# Patient Record
Sex: Female | Born: 1970 | Race: Black or African American | Hispanic: No | Marital: Married | Smoking: Never smoker
Health system: Southern US, Community
[De-identification: ages and names within clinical notes are randomized; demographics above are authoritative.]

---

## 2003-03-09 ENCOUNTER — Emergency Department (HOSPITAL_COMMUNITY): Admission: EM | Admit: 2003-03-09 | Discharge: 2003-03-09 | Payer: Self-pay | Admitting: Emergency Medicine

## 2008-02-18 ENCOUNTER — Emergency Department (HOSPITAL_BASED_OUTPATIENT_CLINIC_OR_DEPARTMENT_OTHER): Admission: EM | Admit: 2008-02-18 | Discharge: 2008-02-19 | Payer: Self-pay | Admitting: Emergency Medicine

## 2010-12-18 LAB — URINE CULTURE: Colony Count: >=100000

## 2010-12-18 LAB — URINALYSIS, ROUTINE W REFLEX MICROSCOPIC
Bilirubin Urine: NEGATIVE
Glucose, UA: NEGATIVE mg/dL
Hgb urine dipstick: NEGATIVE
Ketones, ur: 40 mg/dL — AB
Nitrite: NEGATIVE
Protein, ur: NEGATIVE mg/dL
Specific Gravity, Urine: 1.02 (ref 1.005–1.030)
Urobilinogen, UA: 0.2 mg/dL (ref 0.0–1.0)
pH: 6 (ref 5.0–8.0)

## 2010-12-18 LAB — CBC
HCT: 40.5 % (ref 36.0–46.0)
Hemoglobin: 13.1 g/dL (ref 12.0–15.0)
MCHC: 32.4 g/dL (ref 30.0–36.0)
MCV: 97.1 fL (ref 78.0–100.0)
Platelets: 337 K/uL (ref 150–400)
RBC: 4.17 MIL/uL (ref 3.87–5.11)
RDW: 13 % (ref 11.5–15.5)
WBC: 6.9 K/uL (ref 4.0–10.5)

## 2010-12-18 LAB — COMPREHENSIVE METABOLIC PANEL
Alkaline Phosphatase: 86 U/L (ref 39–117)
BUN: 9 mg/dL (ref 6–23)
CO2: 27 mEq/L (ref 19–32)
GFR calc non Af Amer: >60 mL/min (ref >60)
Glucose, Bld: 77 mg/dL (ref 70–99)
Potassium: 3.5 mEq/L (ref 3.5–5.1)
Total Protein: 7.9 g/dL (ref 6.0–8.3)

## 2010-12-18 LAB — URINE MICROSCOPIC-ADD ON

## 2010-12-18 LAB — POCT TOXICOLOGY PANEL

## 2010-12-18 LAB — COMPREHENSIVE METABOLIC PANEL WITH GFR
ALT: 11 U/L (ref 0–35)
AST: 22 U/L (ref 0–37)
Albumin: 4.3 g/dL (ref 3.5–5.2)
Calcium: 9.4 mg/dL (ref 8.4–10.5)
Chloride: 102 meq/L (ref 96–112)
Creatinine, Ser: 0.7 mg/dL (ref 0.4–1.2)
GFR calc Af Amer: >60 mL/min (ref >60)
Sodium: 138 meq/L (ref 135–145)
Total Bilirubin: 0.5 mg/dL (ref 0.3–1.2)

## 2010-12-18 LAB — ETHANOL: Alcohol, Ethyl (B): <5 mg/dL (ref 0–10)

## 2014-09-21 ENCOUNTER — Emergency Department
Admission: EM | Admit: 2014-09-21 | Discharge: 2014-09-23 | Disposition: A | Discharge status: Other Acute Inpatient Hospital | Payer: Self-pay | Attending: Emergency Medicine | Admitting: Emergency Medicine

## 2014-09-21 ENCOUNTER — Encounter: Payer: Self-pay | Admitting: General Practice

## 2014-09-21 DIAGNOSIS — F200 Paranoid schizophrenia: Secondary | ICD-10-CM | POA: Insufficient documentation

## 2014-09-21 DIAGNOSIS — I10 Essential (primary) hypertension: Secondary | ICD-10-CM

## 2014-09-21 LAB — SALICYLATE LEVEL: Salicylate Lvl: <4 mg/dL (ref 2.8–30.0)

## 2014-09-21 LAB — COMPREHENSIVE METABOLIC PANEL
ALK PHOS: 75 U/L (ref 38–126)
ALT: 13 U/L — AB (ref 14–54)
ANION GAP: 10 (ref 5–15)
AST: 23 U/L (ref 15–41)
Albumin: 4 g/dL (ref 3.5–5.0)
BILIRUBIN TOTAL: 0.2 mg/dL — AB (ref 0.3–1.2)
CO2: 30 mmol/L (ref 22–32)
Calcium: 8.6 mg/dL — ABNORMAL LOW (ref 8.9–10.3)
Chloride: 101 mmol/L (ref 101–111)
Creatinine, Ser: 0.59 mg/dL (ref 0.44–1.00)
GLUCOSE: 117 mg/dL — AB (ref 65–99)
POTASSIUM: 3 mmol/L — AB (ref 3.5–5.1)
SODIUM: 141 mmol/L (ref 135–145)
TOTAL PROTEIN: 7.4 g/dL (ref 6.5–8.1)

## 2014-09-21 LAB — ETHANOL: Alcohol, Ethyl (B): <5 mg/dL (ref <5)

## 2014-09-21 LAB — URINALYSIS COMPLETE WITH MICROSCOPIC (ARMC ONLY)
BACTERIA UA: NONE SEEN
BILIRUBIN URINE: NEGATIVE
GLUCOSE, UA: NEGATIVE mg/dL
HGB URINE DIPSTICK: NEGATIVE
KETONES UR: NEGATIVE mg/dL
Leukocytes, UA: NEGATIVE
NITRITE: NEGATIVE
Protein, ur: NEGATIVE mg/dL
SPECIFIC GRAVITY, URINE: 1.008 (ref 1.005–1.030)
pH: 7 (ref 5.0–8.0)

## 2014-09-21 LAB — CBC
HEMATOCRIT: 37.3 % (ref 35.0–47.0)
HEMOGLOBIN: 12 g/dL (ref 12.0–16.0)
MCH: 30.9 pg (ref 26.0–34.0)
MCHC: 32.1 g/dL (ref 32.0–36.0)
MCV: 96.1 fL (ref 80.0–100.0)
Platelets: 404 10*3/uL (ref 150–440)
RBC: 3.88 MIL/uL (ref 3.80–5.20)
RDW: 14.6 % — ABNORMAL HIGH (ref 11.5–14.5)
WBC: 4.8 10*3/uL (ref 3.6–11.0)

## 2014-09-21 LAB — URINE DRUG SCREEN, QUALITATIVE (ARMC ONLY)
AMPHETAMINES, UR SCREEN: NOT DETECTED
BENZODIAZEPINE, UR SCRN: NOT DETECTED
Barbiturates, Ur Screen: NOT DETECTED
CANNABINOID 50 NG, UR ~~LOC~~: NOT DETECTED
Cocaine Metabolite,Ur ~~LOC~~: NOT DETECTED
MDMA (Ecstasy)Ur Screen: NOT DETECTED
METHADONE SCREEN, URINE: NOT DETECTED
Opiate, Ur Screen: NOT DETECTED
PHENCYCLIDINE (PCP) UR S: NOT DETECTED
Tricyclic, Ur Screen: NOT DETECTED

## 2014-09-21 LAB — ACETAMINOPHEN LEVEL: Acetaminophen (Tylenol), Serum: <10 ug/mL — ABNORMAL LOW (ref 10–30)

## 2014-09-21 MED ORDER — POTASSIUM CHLORIDE CRYS ER 20 MEQ PO TBCR
20.0000 meq | EXTENDED_RELEASE_TABLET | Freq: Two times a day (BID) | ORAL | Status: DC
Start: 2014-09-21 — End: 2014-09-23
  Administered 2014-09-21 – 2014-09-23 (×2): 20 meq via ORAL
  Filled 2014-09-21 (×3): qty 1

## 2014-09-21 NOTE — ED Notes (Signed)
Pt has very strong unpleasant body odor.  When her clothes were brought to storage, they had a very unpleasant odor as well.  Pt provided with shower supplies and told to shower.   She said that she didn't believe the towels were clean.  They "look used" she said.  She was assured the towels were fresh from the laundry.  She demanded that we not send her a bill.  We agreed.

## 2014-09-21 NOTE — ED Notes (Signed)

## 2014-09-21 NOTE — ED Notes (Signed)
BEHAVIORAL HEALTH ROUNDING  Patient sleeping: No.  Patient alert and oriented: yes  Behavior appropriate: Yes. ; If no, describe:  Nutrition and fluids offered: Yes  Toileting and hygiene offered: Yes  Sitter present: not applicable  Law enforcement present: Yes ODS  

## 2014-09-21 NOTE — ED Notes (Signed)
Pt noted to have severe body odor. Pt encouraged several times by day shift to take a bath but refused. Explained to pt that she would need to take a bath or stay in her room. Pt returned to her room. Pt requesting a new room. Explained to pt that she was in the room assigned to her and pt then asked for her dinner. Explained to pt that dinner has already been server and snack would be in a little bit. Pt stating she does not want someone else's left over food but only what her insurance will pay for. Explained to pt that the food was all prepared by dietary services and was not left overs.

## 2014-09-21 NOTE — ED Notes (Signed)
ED BHU PLACEMENT JUSTIFICATION Is the patient under IVC or is there intent for IVC: Yes.   Is the patient medically cleared: Yes.   Is there vacancy in the ED BHU: Yes.   Is the population mix appropriate for patient: Yes.   Is the patient awaiting placement in inpatient or outpatient setting: Yes.   Has the patient had a psychiatric consult: No. Survey of unit performed for contraband, proper placement and condition of furniture, tampering with fixtures in bathroom, shower, and each patient room: Yes.  ; Findings:  APPEARANCE/BEHAVIOR uncooperative NEURO ASSESSMENT Orientation: place and person Hallucinations: No None noted (Hallucinations) Speech: Normal Gait: normal RESPIRATORY ASSESSMENT Normal expansion.  Clear to auscultation.  No rales, rhonchi, or wheezing. CARDIOVASCULAR ASSESSMENT regular rate and rhythm, S1, S2 normal, no murmur, click, rub or gallop GASTROINTESTINAL ASSESSMENT soft, nontender, BS WNL, no r/g EXTREMITIES normal strength, tone, and muscle mass PLAN OF CARE Provide calm/safe environment. Vital signs assessed twice daily. ED BHU Assessment once each 12-hour shift. Collaborate with intake RN daily or as condition indicates. Assure the ED provider has rounded once each shift. Provide and encourage hygiene. Provide redirection as needed. Assess for escalating behavior; address immediately and inform ED provider.  Assess family dynamic and appropriateness for visitation as needed: Yes.  ; If necessary, describe findings:  Educate the patient/family about BHU procedures/visitation: Yes.  ; If necessary, describe findings:

## 2014-09-21 NOTE — ED Notes (Signed)
Patient assigned to appropriate care area. Patient oriented to unit/care area: Informed that, for their safety, care areas are designed for safety and monitored by security cameras at all times; and visiting hours explained to patient. Patient verbalizes understanding, and verbal contract for safety obtained. 

## 2014-09-21 NOTE — ED Notes (Signed)
BEHAVIORAL HEALTH ROUNDING Patient sleeping: No. Patient alert and oriented: yes Behavior appropriate: Yes.  ; If no, describe:  Nutrition and fluids offered: Yes  Toileting and hygiene offered: Yes  Sitter present: no Law enforcement present: Yes  

## 2014-09-21 NOTE — ED Notes (Signed)
Pt refused to take her pill with water stating she is allergic to water. Pt dry swallowed her pill. Pt encouraged to take a drink of water but would not.

## 2014-09-21 NOTE — ED Notes (Signed)
Pt arrives BHU 3.

## 2014-09-21 NOTE — BH Assessment (Addendum)
Assessment Note  NISSA STANNARD is an 44 y.o. female. Pt presents to ED with persecutory thoughts per ED nurse progress note. When asked by TTS Intake the reason she presented to the ED, she reports "I don't have any idea ... I was told that I threatened the police ... I asked them to let me hear the tape where I threatened the police". Pt became verbally hostile with this Clinical research associate and asked "You tell me why am I here". This writer attempted to continue with assessment questions, she then states "I just asked you a question ... I answered your question now you answer my question a conversation is two ways". At this time, this Clinical research associate ended TTS interview questions. Ct is not pleasant in manner nor is she cooperative with staff. Collateral information will be gathered for further detail.    Additional Information: Writer spoke with the patient in order to gain additional information about her and her situation. During the interview the patient was delusional and her thought pattern was disorganized. Patient made statements in reference to her moving to another state, "needing to get in touch with [facility] burnt unit to make sure their paper work was right." She also shared how she was going to travel aboard "but couldn't because security was at my son house and the sky wasn't ready for me to be unsafe..." Patient was pleasant and attempted to be cooperative. However, when asked specific questions, such as contact information for her family, her answer didn't apply to the question.  Her speech was clear but her thoughts were tangential.  Patient has poor hygiene and had a mild body odor. During the time when the patient was discussing her plans to move to another country, she briefly mentioned she was inpatient at [facility] in the past.  Writer contacted [employer] ([phone]) to check their system for past treatment. [facility] stated they didn't show any indication the patient was  receiving treatment, both outpatient and inpatient.  The numbers listed in the patient's chart was disconnected and no other contact information is available. Lilyan Gilford, MS, LCAS, LPC, NCC, CCSI 09/22/2014 4:26 PM  Axis I: Psychotic Disorder NOS Axis II: Deferred Axis III: History reviewed. No pertinent past medical history. Axis IV: other psychosocial or environmental problems Axis V: 21-30 behavior considerably influenced by delusions or hallucinations OR serious impairment in judgment, communication OR inability to function in almost all areas  Past Medical History: History reviewed. No pertinent past medical history.  History reviewed. No pertinent past surgical history.  Family History: No family history on file.  Social History:  reports that she has never smoked. She does not have any smokeless tobacco history on file. She reports that she does not drink alcohol or use illicit drugs.  Additional Social History:  Alcohol / Drug Use History of alcohol / drug use?: No history of alcohol / drug abuse  CIWA: CIWA-Ar BP: (!) 152/92 mmHg Pulse Rate: 95 COWS:    Allergies: No Known Allergies  Home Medications:  (Not in a hospital admission)  OB/GYN Status:  No LMP recorded.  General Assessment Data Location of Assessment: [facility] ED TTS Assessment: In system Is this a Tele or Face-to-Face Assessment?: Face-to-Face Is this an Initial Assessment or a Re-assessment for this encounter?: Initial Assessment Marital status:  (Unable to Assess; pt refused to talk) Juanell Fairly name:  (Unable to Assess; pt refused to talk) Is patient pregnant?: Unknown Pregnancy Status: Unknown Living Arrangements:  (Unable to Assess; pt  refused to talk) Can pt return to current living arrangement?:  (Unable to Assess; pt refused to talk) Admission Status: Involuntary Is patient capable of signing voluntary admission?: No Referral Source:  (Unable to Assess; pt refused to talk) Insurance type:  None  Medical Screening Exam [facility] ONLY) Medical Exam completed: Yes  Crisis Care Plan Living Arrangements:  (Unable to Assess; pt refused to talk) Name of Psychiatrist: Unable to Assess; pt refused to talk Name of Therapist: Unable to Assess; pt refused to talk  Education Status Is patient currently in school?: No Current Grade: N/A Highest grade of school patient has completed:  (Unable to Assess; pt refused to talk) Name of school: N/A Contact person: N/A  Risk to self with the past 6 months Suicidal Ideation:  (Unable to Assess; pt refused to talk) Has patient been a risk to self within the past 6 months prior to admission? :  (Unable to Assess; pt refused to talk) Suicidal Intent:  (Unable to Assess; pt refused to talk) Has patient had any suicidal intent within the past 6 months prior to admission? :  (Unable to Assess; pt refused to talk) Is patient at risk for suicide?:  (Unable to Assess; pt refused to talk) Suicidal Plan?:  (Unable to Assess; pt refused to talk) Has patient had any suicidal plan within the past 6 months prior to admission? :  (Unable to Assess; pt refused to talk) Access to Means:  (Unable to Assess; pt refused to talk) What has been your use of drugs/alcohol within the last 12 months?:  (Unable to Assess; pt refused to talk) Previous Attempts/Gestures:  (Unable to Assess; pt refused to talk) Other Self Harm Risks:  (Unable to Assess; pt refused to talk) Triggers for Past Attempts:  (Unable to Assess; pt refused to talk) Intentional Self Injurious Behavior:  (Unable to Assess; pt refused to talk) Family Suicide History: Unable to assess Recent stressful life event(s):  (Unable to Assess; pt refused to talk) Persecutory voices/beliefs?:  (Unable to Assess; pt refused to talk) Depression:  (Unable to Assess; pt refused to talk) Depression Symptoms:  (Unable to Assess; pt refused to talk) Substance abuse history and/or treatment for substance abuse?:   (Unable to Assess; pt refused to talk) Suicide prevention information given to non-admitted patients: Not applicable  Risk to Others within the past 6 months Homicidal Ideation:  (Unable to Assess; pt refused to talk) Does patient have any lifetime risk of violence toward others beyond the six months prior to admission? :  (Unable to Assess; pt refused to talk) Thoughts of Harm to Others:  (Unable to Assess; pt refused to talk) Current Homicidal Intent:  (Unable to Assess; pt refused to talk) Current Homicidal Plan:  (Unable to Assess; pt refused to talk) Access to Homicidal Means:  (Unable to Assess; pt refused to talk) Identified Victim:  (Unable to Assess; pt refused to talk) History of harm to others?:  (Unable to Assess; pt refused to talk) Assessment of Violence:  (Unable to Assess; pt refused to talk) Violent Behavior Description:  (Unable to Assess; pt refused to talk) Does patient have access to weapons?:  (Unable to Assess; pt refused to talk) Criminal Charges Pending?:  (Unable to Assess; pt refused to talk) Does patient have a court date:  (Unable to Assess; pt refused to talk) Is patient on probation?:  (Unable to Assess; pt refused to talk)  Psychosis Hallucinations:  (Unable to Assess; pt refused to talk) Delusions:  (Unable to Assess; pt  refused to talk)  Mental Status Report Appearance/Hygiene: Unable to Assess Eye Contact: Unable to Assess Motor Activity: Unremarkable Speech: Unable to assess Level of Consciousness: Alert Mood:  (Unable to Assess; pt refused to talk) Affect: Unable to Assess Anxiety Level:  (Unable to Assess; pt refused to talk) Thought Processes: Unable to Assess Judgement: Unable to Assess Orientation: Unable to assess Obsessive Compulsive Thoughts/Behaviors: Unable to Assess  Cognitive Functioning Concentration: Unable to Assess Memory: Unable to Assess IQ:  (Unable to Assess; pt refused to talk) Insight: Unable to Assess Impulse  Control: Unable to Assess Appetite:  (Unable to Assess; pt refused to talk) Sleep: Unable to Assess Vegetative Symptoms: None  ADLScreening [facility] Services) Patient's cognitive ability adequate to safely complete daily activities?: Yes Patient able to express need for assistance with ADLs?: Yes Independently performs ADLs?:  (Pt refused to perform ADLs)  Prior Inpatient Therapy Prior Inpatient Therapy:  (Unable to Assess; pt refused to talk) Prior Therapy Dates:  (Unable to Assess; pt refused to talk) Prior Therapy Facilty/Provider(s):  (Unable to Assess; pt refused to talk) Reason for Treatment:  (Unable to Assess; pt refused to talk)  Prior Outpatient Therapy Prior Outpatient Therapy:  (Unable to Assess; pt refused to talk) Prior Therapy Dates:  (Unable to Assess; pt refused to talk) Prior Therapy Facilty/Provider(s):  (Unable to Assess; pt refused to talk) Reason for Treatment:  (Unable to Assess; pt refused to talk) Does patient have an ACCT team?: Unknown Does patient have Intensive In-House Services?  : No Does patient have [facility] services? : Unknown Does patient have P4CC services?: Unknown  ADL Screening (condition at time of admission) Patient's cognitive ability adequate to safely complete daily activities?: Yes Patient able to express need for assistance with ADLs?: Yes Independently performs ADLs?:  (Pt refused to perform ADLs)       Abuse/Neglect Assessment (Assessment to be complete while patient is alone) Physical Abuse: Denies Verbal Abuse: Denies Sexual Abuse: Denies Exploitation of patient/patient's resources: Denies Self-Neglect: Denies Values / Beliefs Cultural Requests During Hospitalization: None Spiritual Requests During Hospitalization: None Consults Spiritual Care Consult Needed: No Social Work Consult Needed: No Merchant navy officer (For Healthcare) Does patient have an advance directive?: No Would patient like information on creating  an advanced directive?: No - patient declined information    Additional Information 1:1 In Past 12 Months?:  (Unable to Assess; pt refused to talk) CIRT Risk:  (Unable to Assess; pt refused to talk) Elopement Risk:  (Unable to Assess; pt refused to talk) Does patient have medical clearance?: Yes  Child/Adolescent Assessment Running Away Risk: Denies Bed-Wetting: Denies Destruction of Property: Denies Cruelty to Animals: Denies Stealing: Denies Rebellious/Defies Authority: Denies Satanic Involvement: Denies Archivist: Denies Problems at [employer]: Denies Gang Involvement: Denies  Disposition:  Disposition Initial Assessment Completed for this Encounter: Yes Disposition of Patient: Referred to (Psych MD)  On Site Evaluation by:   Reviewed with Physician:    Wilmon Arms 09/21/2014 7:27 PM

## 2014-09-21 NOTE — ED Notes (Signed)
ENVIRONMENTAL ASSESSMENT  Potentially harmful objects out of patient reach: Yes.  Personal belongings secured: Yes.  Patient dressed in hospital provided attire only: Yes.  Plastic bags out of patient reach: Yes.  Patient care equipment (cords, cables, call bells, lines, and drains) shortened, removed, or accounted for: Yes.  Equipment and supplies removed from bottom of stretcher: Yes.  Potentially toxic materials out of patient reach: Yes.  Sharps container removed or out of patient reach: Yes.  ED BHU PLACEMENT JUSTIFICATION  Is the patient under IVC or is there intent for IVC: Yes.  Is the patient medically cleared: Yes.  Is there vacancy in the ED BHU: Yes.  Is the population mix appropriate for patient: Yes.  Is the patient awaiting placement in inpatient or outpatient setting: Yes.  Has the patient had a psychiatric consult: Yes.  Survey of unit performed for contraband, proper placement and condition of furniture, tampering with fixtures in bathroom, shower, and each patient room: Yes. ; Findings: All clear  APPEARANCE/BEHAVIOR  calm, cooperative and adequate rapport can be established  NEURO ASSESSMENT  Orientation: time, place and person  Hallucinations: No.None noted (Hallucinations) pt is however delusional at times.  Speech: Normal  Gait: normal  RESPIRATORY ASSESSMENT  WNL  CARDIOVASCULAR ASSESSMENT  WNL  GASTROINTESTINAL ASSESSMENT  WNL  EXTREMITIES  WNL  PLAN OF CARE  Provide calm/safe environment. Vital signs assessed twice daily. ED BHU Assessment once each 12-hour shift. Collaborate with intake RN daily or as condition indicates. Assure the ED provider has rounded once each shift. Provide and encourage hygiene. Provide redirection as needed. Assess for escalating behavior; address immediately and inform ED provider.  Assess family dynamic and appropriateness for visitation as needed: Yes. ; If necessary, describe findings:  Educate the patient/family about BHU  procedures/visitation: Yes. ; If necessary, describe findings: Pt is calm and cooperative at this time. Pt informed of unit procedures/rules. Pt remains delusional and requires constant reorientation and redirection.  Will continue to monitor.   BEHAVIORAL HEALTH ROUNDING  Patient sleeping: No.  Patient alert and oriented: yes  Behavior appropriate: Yes. ; If no, describe:  Nutrition and fluids offered: Yes  Toileting and hygiene offered: Yes  Sitter present: not applicable  Law enforcement present: Yes ODS

## 2014-09-21 NOTE — ED Notes (Signed)
Pt. Brought in by Police, IVC paperwork. Officer reports pt has been delusional, reports "i have seen yall following me, these people with these scrubs".  Officer reports that she has been  Calling into the department and reporting " i am a [place] government." PT alert and oriented to self, anxious in triage.

## 2014-09-21 NOTE — ED Notes (Addendum)
Pt refuses to answer questions.  Pt very sarcastic.

## 2014-09-21 NOTE — ED Notes (Signed)
Pt now agreeable to take a bath and will be allowed to bath due top her severe body odor. Pt taken to shower by tech Kerrie BuffaloLinda H.

## 2014-09-21 NOTE — ED Provider Notes (Signed)
[facility] Emergency Department Provider Note  ____________________________________________  Time seen: Approximately 5:42 PM  I have reviewed the triage vital signs and the nursing notes.   HISTORY  Chief Complaint Delusional  Patient appears psychotic, limited history due to bizarre responses.  She is also limiting my physical exam by refusing.  HPI Gloria Frank is a 44 y.o. female with no known medical history who presents in the custody of the police with an IVC in place.  They IVC'd her for bizarre and paranoid statements and behavior.She states that people have been following her, that she is a [place] for the government, her grandmother is missing, she does not participate in recycling programs, and when I attempted to listen to her lungs, she pointed out that her lungs are here, pointing at her right shoulder.  He is extremely tangential and I cannot obtain a significant history.  She is, however, relatively calm at this time and has no complaints other than a "cold" with a runny nose and a sore throat.  Her mental symptoms appear severe at this time.  The onset is unknown and impossible to verify.   History reviewed. No pertinent past medical history.  There are no active problems to display for this patient.   History reviewed. No pertinent past surgical history.  No current outpatient prescriptions on file.  Allergies Review of patient's allergies indicates no known allergies.  No family history on file.  Social History History  Substance Use Topics  . Smoking status: Never Smoker   . Smokeless tobacco: Not on file  . Alcohol Use: No    Review of Systems Limited by psychosis Constitutional: No fever/chills Eyes: No visual changes. ENT: Mild sore throat.  Runny nose. Cardiovascular: Denies chest pain. Respiratory: Denies shortness of breath. Gastrointestinal: No abdominal pain.  No nausea, no vomiting.  No diarrhea.  No  constipation. Genitourinary: Negative for dysuria. Musculoskeletal: Negative for back pain. Skin: Negative for rash. Neurological: Negative for headaches, focal weakness or numbness. Psychiatric:Paranoid, bizarre behavior  10-point ROS otherwise negative.  ____________________________________________   PHYSICAL EXAM:  VITAL SIGNS: ED Triage Vitals  Enc Vitals Group     BP 09/21/14 1515 152/92 mmHg     Pulse Rate 09/21/14 1512 95     Resp 09/21/14 1512 18     Temp 09/21/14 1512 98.4 F (36.9 C)     Temp Source 09/21/14 1512 Oral     SpO2 09/21/14 1512 99 %     Weight 09/21/14 1512 175 lb (79.379 kg)     Height 09/21/14 1512 5\' 4"  (1.626 m)     Head Cir --      Peak Flow --      Pain Score --      Pain Loc --      Pain Edu? --      Excl. in GC? --     Constitutional: Alert and oriented initially. Well appearing and in no acute distress. Eyes: Conjunctivae are normal. PERRL. EOMI. Head: Atraumatic. Nose: Mild rhinorrhea Mouth/Throat: Mucous membranes are moist.  Patient does not want me to look in her throat. Neck: No stridor.   Cardiovascular: Normal rate, regular rhythm. Grossly normal heart sounds.  Good peripheral circulation. Respiratory: Normal respiratory effort.  No retractions. Lungs CTAB. Gastrointestinal: Soft and nontender. No distention. No abdominal bruits. No CVA tenderness. Musculoskeletal: No lower extremity tenderness nor edema.  No joint effusions. Neurologic:  Normal speech and language. No gross focal neurologic deficits are  appreciated. Speech is normal.  Ambulating well Skin:  Skin is warm, dry and intact. No rash noted. Psychiatric: Odd affect with paranoia, tangential conversation that has nothing to do with questions asked, bizarre, seemingly random statements. ____________________________________________   LABS (all labs ordered are listed, but only abnormal results are displayed)  Labs Reviewed  ACETAMINOPHEN LEVEL - Abnormal; Notable for  the following:    Acetaminophen (Tylenol), Serum <10 (*)    All other components within normal limits  CBC - Abnormal; Notable for the following:    RDW 14.6 (*)    All other components within normal limits  COMPREHENSIVE METABOLIC PANEL - Abnormal; Notable for the following:    Potassium 3.0 (*)    Glucose, Bld 117 (*)    BUN <5 (*)    Calcium 8.6 (*)    ALT 13 (*)    Total Bilirubin 0.2 (*)    All other components within normal limits  URINALYSIS COMPLETEWITH MICROSCOPIC ([facility] ONLY) - Abnormal; Notable for the following:    Color, Urine STRAW (*)    APPearance CLEAR (*)    Squamous Epithelial / LPF 0-5 (*)    All other components within normal limits  ETHANOL  SALICYLATE LEVEL  URINE DRUG SCREEN, QUALITATIVE ([facility] ONLY)   ____________________________________________  EKG  Not indicated ____________________________________________  RADIOLOGY  Not indicated  ____________________________________________   PROCEDURES  Procedure(s) performed: None  Critical Care performed: No ____________________________________________   INITIAL IMPRESSION / ASSESSMENT AND PLAN / ED COURSE  Pertinent labs & imaging results that were available during my care of the patient were reviewed by me and considered in my medical decision making (see chart for details).  The patient is not endorsing any SI or HI, but she does appear psychotic with paranoid delusions at this time.  She is in no acute distress.  She is under involuntary commitment.  She is appropriate for the [facility] and psychiatry evaluation at this time.  ____________________________________________  FINAL CLINICAL IMPRESSION(S) / ED DIAGNOSES  Final diagnoses:  Paranoid schizophrenia      NEW MEDICATIONS STARTED DURING THIS VISIT:  New Prescriptions   No medications on file     Loleta Rose, MD 09/21/14 1752

## 2014-09-21 NOTE — ED Notes (Signed)
Patient dressed out and belongings placed in [facility] locker room. Patient not wanting to follow instructions. 3 person assistance to get patient to dress out. Patient reoriented multiple times in order to get dressed out.

## 2014-09-21 NOTE — ED Notes (Signed)
Pt challenges In-take counselor.  Interview ended.

## 2014-09-21 NOTE — ED Notes (Signed)
BEHAVIORAL HEALTH ROUNDING Patient sleeping: No. Patient alert and oriented: yes Behavior appropriate: Yes.  ; If no, describe:  Nutrition and fluids offered: Yes  Toileting and hygiene offered: Yes  Sitter present: not applicable Law enforcement present: Yes  

## 2014-09-21 NOTE — ED Notes (Signed)
Pt exited shower.  Towels, etc are dry.  Pt was asked if she had showered and she said "I'm not going to use that stuff.  I told you."  She said the towels were used and she refused to use the soap.  "I'm not using something that you have used."  Pt assured by RN and HT that nothing given her had been used.  She insisted that we give her body wash.  Rn told pt that hospital made very careful choices about supplies to pts.  Pt refused.  RN told pt that, since we were a closed unit, it was very important that we cooperate.  Pts needed to keep themselves from smelling bad because it was offensive to everybody else.  Pt said she didn't smell.

## 2014-09-22 MED ORDER — DIPHENHYDRAMINE HCL 25 MG PO CAPS
50.0000 mg | ORAL_CAPSULE | Freq: Every evening | ORAL | Status: DC | PRN
  Administered 2014-09-22: 25 mg via ORAL
  Administered 2014-09-22: 50 mg via ORAL
  Filled 2014-09-22 (×2): qty 2

## 2014-09-22 MED ORDER — HALOPERIDOL 2 MG PO TABS
2.0000 mg | ORAL_TABLET | Freq: Two times a day (BID) | ORAL | Status: DC
  Administered 2014-09-22: 2 mg via ORAL
  Filled 2014-09-22 (×2): qty 1

## 2014-09-22 NOTE — ED Notes (Signed)
Meal given to Pt.

## 2014-09-22 NOTE — ED Notes (Signed)
BEHAVIORAL HEALTH ROUNDING Patient sleeping: Yes.   Patient alert and oriented: Pt is sleeping Behavior appropriate: Yes.  ; Pt is sleeping Nutrition and fluids offered: Pt is sleeping Toileting and hygiene offered: Pt is sleeping Sitter present: yes Law enforcement present: Yes  

## 2014-09-22 NOTE — ED Notes (Signed)
BEHAVIORAL HEALTH ROUNDING  Patient sleeping: No.  Patient alert and oriented: yes  Behavior appropriate: Yes. ; If no, describe:  Nutrition and fluids offered: Yes  Toileting and hygiene offered: Yes  Sitter present: not applicable  Law enforcement present: Yes ODS  

## 2014-09-22 NOTE — BHH Counselor (Signed)
Referral Information faxed to Hospital for [facility]patient [facility];  [facility]-[phone] [facility]-[phone] [phone] [facility]-[phone] [facility], [phone]  No Beds at; [phone] [place]-[phone] [place]-[phone] [facility][phone] [facility]-[phone]

## 2014-09-22 NOTE — ED Notes (Signed)
BEHAVIORAL HEALTH ROUNDING Patient sleeping: No. Patient alert and oriented: yes Behavior appropriate: Yes.  ;  Nutrition and fluids offered: Yes  Toileting and hygiene offered: Yes  Sitter present: yes Law enforcement present: Yes  

## 2014-09-22 NOTE — ED Notes (Signed)

## 2014-09-22 NOTE — ED Notes (Signed)
Pt up to desk stating she is congested and wants some type of allergy medication. Dr Zenda AlpersWebster informed.

## 2014-09-22 NOTE — ED Notes (Signed)
ED BHU PLACEMENT JUSTIFICATION Is the patient under IVC or is there intent for IVC: Yes.   Is the patient medically cleared: Yes.   Is there vacancy in the ED BHU: Yes.   Is the population mix appropriate for patient: Yes.   Is the patient awaiting placement in inpatient or outpatient setting: Yes.   Has the patient had a psychiatric consult: Yes.   Survey of unit performed for contraband, proper placement and condition of furniture, tampering with fixtures in bathroom, shower, and each patient room: Yes.  ; Findings:  APPEARANCE/BEHAVIOR calm and adequate rapport can be established NEURO ASSESSMENT Orientation: place and person Hallucinations: Yes.  Visual Hallucinations Speech: Normal Gait: normal RESPIRATORY ASSESSMENT Normal expansion.  Clear to auscultation.  No rales, rhonchi, or wheezing., No chest wall tenderness. CARDIOVASCULAR ASSESSMENT regular rate and rhythm, S1, S2 normal, no murmur, click, rub or gallop GASTROINTESTINAL ASSESSMENT soft, nontender, BS WNL, no r/g EXTREMITIES normal strength, tone, and muscle mass, no deformities, no erythema, induration, or nodules, no evidence of joint effusion, ROM of all joints is normal PLAN OF CARE Provide calm/safe environment. Vital signs assessed twice daily. ED BHU Assessment once each 12-hour shift. Collaborate with intake RN daily or as condition indicates. Assure the ED provider has rounded once each shift. Provide and encourage hygiene. Provide redirection as needed. Assess for escalating behavior; address immediately and inform ED provider.  Assess family dynamic and appropriateness for visitation as needed: No.; If necessary, describe findings: Pt's family dynamics have not been assess since family in no present.  Educate the patient/family about BHU procedures/visitation: Yes.  ; If necessary, describe findings: Pt aware of rules of BHU.

## 2014-09-22 NOTE — ED Notes (Signed)
BEHAVIORAL HEALTH ROUNDING Patient sleeping: Yes.   Patient alert and oriented: not applicable SLEEPING Behavior appropriate: Yes.  ; If no, describe: SLEEPING Nutrition and fluids offered: No SLEEPING Toileting and hygiene offered: NoSLEEPING Sitter present: not applicable Law enforcement present: Yes ODS 

## 2014-09-22 NOTE — ED Notes (Signed)
Pt given a bedtime snack.  

## 2014-09-22 NOTE — ED Notes (Signed)
Pt is pacing from her room to the bathroom. Pt was redirected to her room.

## 2014-09-22 NOTE — ED Notes (Signed)
Pt ambulated to the bathroom and took trash out of the trash bag. Pt was redirected and told to put it back in the trash bag and go back to the room. Pt went back to her room.

## 2014-09-22 NOTE — ED Notes (Signed)
Pt became confrontational and argumentative, Roberts GaudyHenry RN and security redirected the Pt and deescalated the situation.

## 2014-09-22 NOTE — ED Notes (Signed)
BEHAVIORAL HEALTH ROUNDING Patient sleeping: Yes.   Patient alert and oriented: sleeping Behavior appropriate: Yes.  ; If no, describe:  Nutrition and fluids offered: sleeping Toileting and hygiene offered: sleeping Sitter present: no Law enforcement present: Yes  

## 2014-09-22 NOTE — ED Provider Notes (Signed)
-----------------------------------------   8:21 AM on 09/22/2014 -----------------------------------------   BP 155/86 mmHg  Pulse 74  Temp(Src) 97.9 F (36.6 C) (Oral)  Resp 18  Ht 5\' 4"  (1.626 m)  Wt 175 lb (79.379 kg)  BMI 30.02 kg/m2  SpO2 100%  The patient had no acute events since last update.  Patient seen last night for likely psychosis. Patient's medical workup is largely within normal limits, with a negative urinary tox screen. Patient is under an involuntary commitment at this time. Currently awaiting psychiatric disposition.   Minna AntisKevin Takuma Cifelli, MD 09/22/14 [phone]

## 2014-09-22 NOTE — ED Notes (Signed)
BEHAVIORAL HEALTH ROUNDING Patient sleeping: Yes.   Patient alert and oriented: sleeping Behavior appropriate: Yes.  ; If no, describe:  Nutrition and fluids offered: Yes  Toileting and hygiene offered: Yes  Sitter present: no Law enforcement present: Yes

## 2014-09-22 NOTE — Consult Note (Signed)
[facility] Consult   Reason for Consult:  Follow up Referring Physician:  ER Patient Identification: Gloria Frank MRN:  [id number] Principal Diagnosis: <principal problem not specified> Diagnosis:  There are no active problems to display for this patient.   Total Time spent with patient: 45 minutes  Subjective:   Gloria Frank is a 44 y.o. female patient admitted with a lon g H/o MI and is non-complaint with meds and is a poor historian. Pt was seen in [place] and she cursed out the undersigned and refused to talk to any staff member.Marland Kitchen  HPI:  As above HPI Elements:     Past Medical History: History reviewed. No pertinent past medical history. History reviewed. No pertinent past surgical history. Family History: No family history on file. Social History:  History  Alcohol Use No     History  Drug Use No    History   Social History  . Marital Status: Married    Spouse Name: N/A  . Number of Children: N/A  . Years of Education: N/A   Social History Main Topics  . Smoking status: Never Smoker   . Smokeless tobacco: Not on file  . Alcohol Use: No  . Drug Use: No  . Sexual Activity: Not on file   Other Topics Concern  . None   Social History Narrative  . None   Additional Social History:    History of alcohol / drug use?: No history of alcohol / drug abuse                     Allergies:  No Known Allergies  Labs:  Results for orders placed or performed during the hospital encounter of 09/21/14 (from the past 48 hour(s))  Urinalysis complete, with microscopic ([facility] only)     Status: Abnormal   Collection Time: 09/21/14  4:01 PM  Result Value Ref Range   Color, Urine STRAW (A) YELLOW   APPearance CLEAR (A) CLEAR   Glucose, UA NEGATIVE NEGATIVE mg/dL   Bilirubin Urine NEGATIVE NEGATIVE   Ketones, ur NEGATIVE NEGATIVE mg/dL   Specific Gravity, Urine 1.008 1.005 - 1.030   Hgb urine dipstick NEGATIVE NEGATIVE   pH 7.0 5.0  - 8.0   Protein, ur NEGATIVE NEGATIVE mg/dL   Nitrite NEGATIVE NEGATIVE   Leukocytes, UA NEGATIVE NEGATIVE   RBC / HPF 0-5 0 - 5 RBC/hpf   WBC, UA 0-5 0 - 5 WBC/hpf   Bacteria, UA NONE SEEN NONE SEEN   Squamous Epithelial / LPF 0-5 (A) NONE SEEN  Urine Drug Screen, Qualitative ([facility] only)     Status: None   Collection Time: 09/21/14  4:01 PM  Result Value Ref Range   Tricyclic, Ur Screen NONE DETECTED NONE DETECTED   Amphetamines, Ur Screen NONE DETECTED NONE DETECTED   MDMA (Ecstasy)Ur Screen NONE DETECTED NONE DETECTED   Cocaine Metabolite,Ur [place] NONE DETECTED NONE DETECTED   Opiate, Ur Screen NONE DETECTED NONE DETECTED   Phencyclidine (PCP) Ur S NONE DETECTED NONE DETECTED   Cannabinoid 50 Ng, Ur [place] NONE DETECTED NONE DETECTED   Barbiturates, Ur Screen NONE DETECTED NONE DETECTED   Benzodiazepine, Ur Scrn NONE DETECTED NONE DETECTED   Methadone Scn, Ur NONE DETECTED NONE DETECTED    Comment: (NOTE) 798  Tricyclics, urine               Cutoff 1000 ng/mL 200  Amphetamines, urine  Cutoff 1000 ng/mL 300  MDMA (Ecstasy), urine           Cutoff 500 ng/mL 400  Cocaine Metabolite, urine       Cutoff 300 ng/mL 500  Opiate, urine                   Cutoff 300 ng/mL 600  Phencyclidine (PCP), urine      Cutoff 25 ng/mL 700  Cannabinoid, urine              Cutoff 50 ng/mL 800  Barbiturates, urine             Cutoff 200 ng/mL 900  Benzodiazepine, urine           Cutoff 200 ng/mL 1000 Methadone, urine                Cutoff 300 ng/mL 1100 1200 The urine drug screen provides only a preliminary, unconfirmed 1300 analytical test result and should not be used for non-medical 1400 purposes. Clinical consideration and professional judgment should 1500 be applied to any positive drug screen result due to possible 1600 interfering substances. A more specific alternate chemical method 1700 must be used in order to obtain a confirmed analytical result.  1800 Gas chromato graphy / mass  spectrometry (GC/MS) is the preferred 1900 confirmatory method.   Acetaminophen level     Status: Abnormal   Collection Time: 09/21/14  4:50 PM  Result Value Ref Range   Acetaminophen (Tylenol), Serum <10 (L) 10 - 30 ug/mL    Comment:        THERAPEUTIC CONCENTRATIONS VARY SIGNIFICANTLY. A RANGE OF 10-30 ug/mL MAY BE AN EFFECTIVE CONCENTRATION FOR MANY PATIENTS. HOWEVER, SOME ARE BEST TREATED AT CONCENTRATIONS OUTSIDE THIS RANGE. ACETAMINOPHEN CONCENTRATIONS >150 ug/mL AT 4 HOURS AFTER INGESTION AND >50 ug/mL AT 12 HOURS AFTER INGESTION ARE OFTEN ASSOCIATED WITH TOXIC REACTIONS.   CBC     Status: Abnormal   Collection Time: 09/21/14  4:50 PM  Result Value Ref Range   WBC 4.8 3.6 - 11.0 K/uL   RBC 3.88 3.80 - 5.20 MIL/uL   Hemoglobin 12.0 12.0 - 16.0 g/dL   HCT 37.3 35.0 - 47.0 %   MCV 96.1 80.0 - 100.0 fL   MCH 30.9 26.0 - 34.0 pg   MCHC 32.1 32.0 - 36.0 g/dL   RDW 14.6 (H) 11.5 - 14.5 %   Platelets 404 150 - 440 K/uL  Comprehensive metabolic panel     Status: Abnormal   Collection Time: 09/21/14  4:50 PM  Result Value Ref Range   Sodium 141 135 - 145 mmol/L   Potassium 3.0 (L) 3.5 - 5.1 mmol/L   Chloride 101 101 - 111 mmol/L   CO2 30 22 - 32 mmol/L   Glucose, Bld 117 (H) 65 - 99 mg/dL   BUN <5 (L) 6 - 20 mg/dL   Creatinine, Ser 0.59 0.44 - 1.00 mg/dL   Calcium 8.6 (L) 8.9 - 10.3 mg/dL   Total Protein 7.4 6.5 - 8.1 g/dL   Albumin 4.0 3.5 - 5.0 g/dL   AST 23 15 - 41 U/L   ALT 13 (L) 14 - 54 U/L   Alkaline Phosphatase 75 38 - 126 U/L   Total Bilirubin 0.2 (L) 0.3 - 1.2 mg/dL   GFR calc non Af Amer >60 >60 mL/min   GFR calc Af Amer >60 >60 mL/min    Comment: (NOTE) The eGFR has been calculated using the CKD EPI equation. This  calculation has not been validated in all clinical situations. eGFR's persistently <60 mL/min signify possible Chronic Kidney Disease.    Anion gap 10 5 - 15  Ethanol (ETOH)     Status: None   Collection Time: 09/21/14  4:50 PM  Result  Value Ref Range   Alcohol, Ethyl (B) <5 <5 mg/dL    Comment:        LOWEST DETECTABLE LIMIT FOR SERUM ALCOHOL IS 5 mg/dL FOR MEDICAL PURPOSES ONLY   Salicylate level     Status: None   Collection Time: 09/21/14  4:50 PM  Result Value Ref Range   Salicylate Lvl <9.3 2.8 - 30.0 mg/dL    Vitals: Blood pressure 121/63, pulse 72, temperature 98 F (36.7 C), temperature source Oral, resp. rate 18, height $RemoveBe'5\' 4"'PztmZecEy$  (1.626 m), weight 79.379 kg (175 lb), SpO2 100 %.  Risk to Self: Suicidal Ideation:  (Unable to Assess; pt refused to talk) Suicidal Intent:  (Unable to Assess; pt refused to talk) Is patient at risk for suicide?:  (Unable to Assess; pt refused to talk) Suicidal Plan?:  (Unable to Assess; pt refused to talk) Access to Means:  (Unable to Assess; pt refused to talk) What has been your use of drugs/alcohol within the last 12 months?:  (Unable to Assess; pt refused to talk) Other Self Harm Risks:  (Unable to Assess; pt refused to talk) Triggers for Past Attempts:  (Unable to Assess; pt refused to talk) Intentional Self Injurious Behavior:  (Unable to Assess; pt refused to talk) Risk to Others: Homicidal Ideation:  (Unable to Assess; pt refused to talk) Thoughts of Harm to Others:  (Unable to Assess; pt refused to talk) Current Homicidal Intent:  (Unable to Assess; pt refused to talk) Current Homicidal Plan:  (Unable to Assess; pt refused to talk) Access to Homicidal Means:  (Unable to Assess; pt refused to talk) Identified Victim:  (Unable to Assess; pt refused to talk) History of harm to others?:  (Unable to Assess; pt refused to talk) Assessment of Violence:  (Unable to Assess; pt refused to talk) Violent Behavior Description:  (Unable to Assess; pt refused to talk) Does patient have access to weapons?:  (Unable to Assess; pt refused to talk) Criminal Charges Pending?:  (Unable to Assess; pt refused to talk) Does patient have a court date:  (Unable to Assess; pt refused to  talk) Prior Inpatient Therapy: Prior Inpatient Therapy:  (Unable to Assess; pt refused to talk) Prior Therapy Dates:  (Unable to Assess; pt refused to talk) Prior Therapy Facilty/Provider(s):  (Unable to Assess; pt refused to talk) Reason for Treatment:  (Unable to Assess; pt refused to talk) Prior Outpatient Therapy: Prior Outpatient Therapy:  (Unable to Assess; pt refused to talk) Prior Therapy Dates:  (Unable to Assess; pt refused to talk) Prior Therapy Facilty/Provider(s):  (Unable to Assess; pt refused to talk) Reason for Treatment:  (Unable to Assess; pt refused to talk) Does patient have an ACCT team?: Unknown Does patient have Intensive In-House Services?  : No Does patient have [facility] services? : Unknown Does patient have P4CC services?: Unknown  Current Facility-Administered Medications  Medication Dose Route Frequency Provider Last Rate Last Dose  . diphenhydrAMINE (BENADRYL) capsule 50 mg  50 mg Oral QHS PRN Loney Hering, MD   50 mg at 09/22/14 0035  . potassium chloride SA (K-DUR,KLOR-CON) CR tablet 20 mEq  20 mEq Oral BID Hinda Kehr, MD   20 mEq at 09/21/14 2226   No current outpatient prescriptions on  file.    Musculoskeletal: Strength & Muscle Tone: within normal limits Gait & Station: normal Patient leans: N/A  Psychiatric Specialty Exam: Physical Exam  Review of Systems  Constitutional: Negative.   HENT: Negative.   Eyes: Negative.   Respiratory: Negative.   Cardiovascular: Negative.   Gastrointestinal: Negative.   Genitourinary: Negative.   Musculoskeletal: Negative.   Skin: Negative.   Neurological: Negative.   Endo/Heme/Allergies: Negative.   Psychiatric/Behavioral: Positive for hallucinations. The patient is nervous/anxious.     Blood pressure 121/63, pulse 72, temperature 98 F (36.7 C), temperature source Oral, resp. rate 18, height $RemoveBe'5\' 4"'zGBlcKKmF$  (1.626 m), weight 79.379 kg (175 lb), SpO2 100 %.Body mass index is 30.02 kg/(m^2).  General  Appearance: Casual  Eye Contact::  Minimal  Speech:  Pressured  Volume:  Increased  Mood:  Angry  Affect:  Inappropriate  Thought Process:  Disorganized  Orientation:  Other:  could not test  Thought Content:  Delusions, Hallucinations: Auditory Command:  Pt stated that she will not talk to anyone that is not of her race. Visual, Paranoid Ideation and hearing voices and refuses to talk  Suicidal Thoughts:  No  Homicidal Thoughts:  No  Memory:  Immediate;   Poor Recent;   Poor Remote;   Poor confused  Judgement:  Impaired  Insight:  Lacking  Psychomotor Activity:  Increased  Concentration:  Poor  Recall:  Poor  Fund of Knowledge:Poor  Language: Poor  Akathisia:  No  Handed:  Right  AIMS (if indicated):     Assets:  Others:  none available  ADL's:  Impaired  Cognition: Impaired,  Moderate  Sleep:      Medical Decision Making: Review of New Medication or Change in Dosage (2)  Treatment Plan Summary: Plan Start pt on anti-psychotic meds to help her calm down and for re-eval in AM of 09/23/2014 for appropriate disposition and planning.  Plan:  No evidence of imminent risk to self or others at present.   Disposition: as above.  Dewain Penning 09/22/2014 4:02 PM

## 2014-09-22 NOTE — ED Notes (Signed)
Pt given ordered medications and took with several sips of water. Pt did not say anything about being allergic to water at this time.

## 2014-09-22 NOTE — BH Assessment (Signed)
Writer unable to obtain collateral information.  The number listed in the epic chart [phone]) has been disconnected.

## 2014-09-22 NOTE — ED Notes (Signed)
Patient assigned to appropriate care area. Patient oriented to unit/care area: Informed that, for their safety, care areas are designed for safety and monitored by security cameras at all times; and visiting hours explained to patient. Patient verbalizes understanding, and verbal contract for safety obtained. 

## 2014-09-22 NOTE — ED Notes (Signed)
BEHAVIORAL HEALTH ROUNDING Patient sleeping: No. Patient alert and oriented: yes Behavior appropriate: Yes.  ; If no, describe:  Nutrition and fluids offered: Yes  Toileting and hygiene offered: Yes  Sitter present: no Law enforcement present: Yes  

## 2014-09-22 NOTE — ED Notes (Signed)
ED BHU PLACEMENT JUSTIFICATION Is the patient under IVC or is there intent for IVC: Yes.   Is the patient medically cleared: Yes.   Is there vacancy in the ED BHU: Yes.   Is the population mix appropriate for patient: Yes.   Is the patient awaiting placement in inpatient or outpatient setting: Yes.   Has the patient had a psychiatric consult: No. Survey of unit performed for contraband, proper placement and condition of furniture, tampering with fixtures in bathroom, shower, and each patient room: Yes.  ; Findings:  APPEARANCE/BEHAVIOR calm and cooperative NEURO ASSESSMENT Orientation: sleeping Hallucinations: No.None noted (Hallucinations) Speech: Normal Gait: sleeping RESPIRATORY ASSESSMENT Normal expansion.  Clear to auscultation.  No rales, rhonchi, or wheezing. CARDIOVASCULAR ASSESSMENT regular rate and rhythm, S1, S2 normal, no murmur, click, rub or gallop GASTROINTESTINAL ASSESSMENT soft, nontender, BS WNL, no r/g EXTREMITIES normal strength, tone, and muscle mass PLAN OF CARE Provide calm/safe environment. Vital signs assessed twice daily. ED BHU Assessment once each 12-hour shift. Collaborate with intake RN daily or as condition indicates. Assure the ED provider has rounded once each shift. Provide and encourage hygiene. Provide redirection as needed. Assess for escalating behavior; address immediately and inform ED provider.  Assess family dynamic and appropriateness for visitation as needed: Yes.  ; If necessary, describe findings:  Educate the patient/family about BHU procedures/visitation: Yes.  ; If necessary, describe findings:

## 2014-09-23 ENCOUNTER — Inpatient Hospital Stay
Admission: EM | Admit: 2014-09-23 | Discharge: 2014-10-29 | DRG: 885 | Disposition: A | Discharge status: Home / Self Care | Payer: No Typology Code available for payment source | Source: Intra-hospital | Attending: Psychiatry | Admitting: Psychiatry

## 2014-09-23 DIAGNOSIS — N390 Urinary tract infection, site not specified: Secondary | ICD-10-CM | POA: Diagnosis present

## 2014-09-23 DIAGNOSIS — I10 Essential (primary) hypertension: Secondary | ICD-10-CM | POA: Diagnosis present

## 2014-09-23 DIAGNOSIS — F22 Delusional disorders: Secondary | ICD-10-CM | POA: Diagnosis present

## 2014-09-23 DIAGNOSIS — F203 Undifferentiated schizophrenia: Secondary | ICD-10-CM

## 2014-09-23 DIAGNOSIS — G4700 Insomnia, unspecified: Secondary | ICD-10-CM | POA: Diagnosis present

## 2014-09-23 DIAGNOSIS — Z9119 Patient's noncompliance with other medical treatment and regimen: Secondary | ICD-10-CM | POA: Diagnosis present

## 2014-09-23 DIAGNOSIS — R0981 Nasal congestion: Secondary | ICD-10-CM | POA: Diagnosis present

## 2014-09-23 DIAGNOSIS — A150 Tuberculosis of lung: Secondary | ICD-10-CM

## 2014-09-23 DIAGNOSIS — E221 Hyperprolactinemia: Secondary | ICD-10-CM | POA: Diagnosis present

## 2014-09-23 DIAGNOSIS — K5900 Constipation, unspecified: Secondary | ICD-10-CM | POA: Diagnosis present

## 2014-09-23 DIAGNOSIS — F209 Schizophrenia, unspecified: Secondary | ICD-10-CM | POA: Diagnosis not present

## 2014-09-23 DIAGNOSIS — R000 Tachycardia, unspecified: Secondary | ICD-10-CM | POA: Diagnosis present

## 2014-09-23 DIAGNOSIS — Z9114 Patient's other noncompliance with medication regimen: Secondary | ICD-10-CM | POA: Diagnosis present

## 2014-09-23 LAB — LIPID PANEL
CHOL/HDL RATIO: 3.2 ratio
Cholesterol: 175 mg/dL (ref 0–200)
HDL: 55 mg/dL (ref >40)
LDL CALC: 98 mg/dL (ref 0–99)
TRIGLYCERIDES: 110 mg/dL (ref <150)
VLDL: 22 mg/dL (ref 0–40)

## 2014-09-23 LAB — TSH: TSH: 1.979 u[IU]/mL (ref 0.350–4.500)

## 2014-09-23 MED ORDER — LISINOPRIL 5 MG PO TABS
5.0000 mg | ORAL_TABLET | Freq: Every day | ORAL | Status: DC
Start: 2014-09-23 — End: 2014-10-15
  Administered 2014-09-23 – 2014-10-15 (×18): 5 mg via ORAL
  Filled 2014-09-23 (×24): qty 1

## 2014-09-23 MED ORDER — ACETAMINOPHEN 325 MG PO TABS
650.0000 mg | ORAL_TABLET | Freq: Four times a day (QID) | ORAL | Status: DC | PRN
  Administered 2014-10-04 – 2014-10-24 (×9): 650 mg via ORAL
  Filled 2014-09-23 (×10): qty 2

## 2014-09-23 MED ORDER — RISPERIDONE 1 MG PO TBDP
1.0000 mg | ORAL_TABLET | Freq: Two times a day (BID) | ORAL | Status: DC
  Administered 2014-09-23: 1 mg via ORAL
  Filled 2014-09-23 (×3): qty 1

## 2014-09-23 MED ORDER — MAGNESIUM HYDROXIDE 400 MG/5ML PO SUSP
30.0000 mL | Freq: Every day | ORAL | Status: DC | PRN
  Administered 2014-10-06: 30 mL via ORAL
  Filled 2014-09-23: qty 30

## 2014-09-23 MED ORDER — ALUM & MAG HYDROXIDE-SIMETH 200-200-20 MG/5ML PO SUSP
30.0000 mL | ORAL | Status: DC | PRN
  Administered 2014-10-10 – 2014-10-27 (×3): 30 mL via ORAL
  Filled 2014-09-23 (×4): qty 30

## 2014-09-23 MED ORDER — LORAZEPAM 1 MG PO TABS: 1.0000 mg | ORAL_TABLET | ORAL | Status: DC | PRN

## 2014-09-23 NOTE — ED Notes (Signed)
Report received from Yahoo! IncKim RN. Patient care assumed. Patient/RN introduction complete. Pt in dayroom watching tv awaiting admission to beh med unit.  Will continue to monitor.

## 2014-09-23 NOTE — ED Notes (Signed)
BEHAVIORAL HEALTH ROUNDING Patient sleeping: No. Patient alert and oriented: no Behavior appropriate: No.; If no, describe:  Nutrition and fluids offered: Yes  Toileting and hygiene offered: Yes  Sitter present: not applicable Law enforcement present: Yes  

## 2014-09-23 NOTE — ED Notes (Signed)

## 2014-09-23 NOTE — ED Notes (Signed)
Pt refuses haldol, states she does not take this and it does not help her. Does take her potassium. States she is afraid she is going to surgery and that her organs will be stolen. Assured she is not going to surgery.

## 2014-09-23 NOTE — ED Notes (Signed)
BEHAVIORAL HEALTH ROUNDING Patient sleeping: No. Patient alert and oriented: yes Behavior appropriate: Yes.  ; If no, describe:  Nutrition and fluids offered: Yes  Toileting and hygiene offered: Yes  Sitter present: not applicable Law enforcement present: Yes  

## 2014-09-23 NOTE — ED Notes (Signed)
BEHAVIORAL HEALTH ROUNDING Patient sleeping: Yes.   Patient alert and oriented: sleeping Behavior appropriate: sleeping Nutrition and fluids offered: sleeping Toileting and hygiene offered: sleeping Sitter present: yes Law enforcement present: Yes  

## 2014-09-23 NOTE — ED Notes (Signed)
BEHAVIORAL HEALTH ROUNDING Patient sleeping: Yes.   Patient alert and oriented: Pt is sleeping.  Behavior appropriate: Pt is sleeping Nutrition and fluids offered: Pt is sleeping.  Toileting and hygiene offered: Pt is sleeping.  Sitter present: yes Law enforcement present: Yes  

## 2014-09-23 NOTE — ED Notes (Signed)
BEHAVIORAL HEALTH ROUNDING Patient sleeping: No. Patient alert and oriented: no Behavior appropriate: No.; If no, describe: hostile Nutrition and fluids offered: Yes  Toileting and hygiene offered: Yes  Sitter present: not applicable Law enforcement present: Yes

## 2014-09-23 NOTE — ED Notes (Signed)
BEHAVIORAL HEALTH ROUNDING Patient sleeping: No. Patient alert and oriented: no Behavior appropriate: No.; If no, describe: hostile Nutrition and fluids offered: Yes  Toileting and hygiene offered: Yes  Sitter present: not applicable Law enforcement present: Yes  

## 2014-09-23 NOTE — ED Notes (Signed)
BEHAVIORAL HEALTH ROUNDING Patient sleeping: Yes.   Patient alert and oriented: no Behavior appropriate: No.; If no, describe:  Nutrition and fluids offered: Yes  Toileting and hygiene offered: Yes  Sitter present: not applicable Law enforcement present: Yes  

## 2014-09-23 NOTE — ED Notes (Signed)
ED BHU PLACEMENT JUSTIFICATION Is the patient under IVC or is there intent for IVC: Yes.   Is the patient medically cleared: Yes.   Is there vacancy in the ED BHU: Yes.   Is the population mix appropriate for patient: No. Is the patient awaiting placement in inpatient or outpatient setting: Yes.   Has the patient had a psychiatric consult: Yes.   Survey of unit performed for contraband, proper placement and condition of furniture, tampering with fixtures in bathroom, shower, and each patient room: Yes.  ; Findings:  APPEARANCE/BEHAVIOR calm, cooperative and adequate rapport can be established NEURO ASSESSMENT Orientation: time, place and person Hallucinations: Yes.  Auditory Hallucinations and Visual Hallucinations Speech: Normal Gait: normal RESPIRATORY ASSESSMENT Normal expansion.  Clear to auscultation.  No rales, rhonchi, or wheezing. CARDIOVASCULAR ASSESSMENT regular rate and rhythm, S1, S2 normal, no murmur, click, rub or gallop GASTROINTESTINAL ASSESSMENT soft, nontender, BS WNL, no r/g EXTREMITIES normal strength, tone, and muscle mass PLAN OF CARE Provide calm/safe environment. Vital signs assessed twice daily. ED BHU Assessment once each 12-hour shift. Collaborate with intake RN daily or as condition indicates. Assure the ED provider has rounded once each shift. Provide and encourage hygiene. Provide redirection as needed. Assess for escalating behavior; address immediately and inform ED provider.  Assess family dynamic and appropriateness for visitation as needed: Yes.  ; If necessary, describe findings:  Educate the patient/family about BHU procedures/visitation: Yes.  ; If necessary, describe findings: .

## 2014-09-23 NOTE — Consult Note (Signed)
[facility] Consult   Reason for Consult:  Consult for this 44 year old woman who was brought in by law enforcement because of bizarre behavior Referring Physician:  Edd Fabian Patient Identification: Gloria Frank MRN:  [id number] Principal Diagnosis: Schizophrenia Diagnosis:   Patient Active Problem List   Diagnosis Date Noted  . Schizophrenia [F20.9] 09/23/2014  . High blood pressure [I10] 09/23/2014  . Homelessness [Z59.0] 09/23/2014    Total Time spent with patient: 1 hour  Subjective:   Gloria Frank is a 44 y.o. female patient admitted with "I don't know why I'm here". Patient is a poor historian. She does however make multiple delusional and psychotic statements.Marland Kitchen  HPI:  Information from the patient and the chart. Patient was brought into the emergency room under involuntary commitment because she was found exhibiting bizarre behavior. The patient is not even able to give a reliable description of the events leading to hospitalization. She tells a rambling story about how she had been living in [place] and then living in [place]. She cannot describe why police were approaching her or what transpired when they approached her. Every time she starts to describe the situation and she will veer off into bizarre nonsensical statements. The patient denies that she is abusing any alcohol or drugs. She claims she is not taking any prescription medicine. Her only complaint is feeling that she has a sinus infection manifested by nasal congestion. She talks about how she wants to be reunited with her children but the details are unclear.  Past psychiatric history: Patient denies that she's ever been in a psychiatric hospital before but when I named several of them she clearly recognize them. Old records show that she's had at least one prior visit to an emergency room in our system at which time she was also identified as having psychotic symptoms. It was  mentioned them that she had been on Risperdal in the past. Patient denies any history of suicide attempts or violent behavior. Won't name any other medications that she has taken.  Social history: It is unclear where she might be living. She does me that she has been traveling around for several months. This may or may not be true. She won't tell me an address where she lives. Mentions both [place] and [place] and being here in [place]. She won't tell me anything about specific relatives that she stays in touch with. She claims to have 4 children but says they are all living in different places "scattered". Doesn't appear to be working. Unclear whether she gets disability.  Medical history: Patient denies any significant ongoing medical problems but acutely says that she has a sinus infection. It is causing nasal congestion. Denies that she is on any medication.  Substance abuse history: She denies that she drinks or uses any street drugs and also denies smoking. Says that she's never had a drug or alcohol problem in the past.  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 Severity:  Severe and impairing her ability to take care of herself. Timing:  Unknown probably chronic. Duration:  Still going strong after being in the emergency room for a day. Context:  Unknown context with probably noncompliant with treatment.  Past Medical History: History reviewed. No pertinent past medical history. History reviewed. No pertinent past surgical history. Family History: No family history on file. Social History:  History  Alcohol Use No     History  Drug Use No  History   Social History  . Marital Status: Married    Spouse Name: N/A  . Number of Children: N/A  . Years of Education: N/A   Social History Main Topics  . Smoking status: Never Smoker   . Smokeless tobacco: Not on file  . Alcohol Use: No  . Drug Use:  No  . Sexual Activity: Not on file   Other Topics Concern  . None   Social History Narrative   Additional Social History:    History of alcohol / drug use?: No history of alcohol / drug abuse                     Allergies:  No Known Allergies  Labs:  Results for orders placed or performed during the hospital encounter of 09/21/14 (from the past 48 hour(s))  Urinalysis complete, with microscopic ([facility] only)     Status: Abnormal   Collection Time: 09/21/14  4:01 PM  Result Value Ref Range   Color, Urine STRAW (A) YELLOW   APPearance CLEAR (A) CLEAR   Glucose, UA NEGATIVE NEGATIVE mg/dL   Bilirubin Urine NEGATIVE NEGATIVE   Ketones, ur NEGATIVE NEGATIVE mg/dL   Specific Gravity, Urine 1.008 1.005 - 1.030   Hgb urine dipstick NEGATIVE NEGATIVE   pH 7.0 5.0 - 8.0   Protein, ur NEGATIVE NEGATIVE mg/dL   Nitrite NEGATIVE NEGATIVE   Leukocytes, UA NEGATIVE NEGATIVE   RBC / HPF 0-5 0 - 5 RBC/hpf   WBC, UA 0-5 0 - 5 WBC/hpf   Bacteria, UA NONE SEEN NONE SEEN   Squamous Epithelial / LPF 0-5 (A) NONE SEEN  Urine Drug Screen, Qualitative ([facility] only)     Status: None   Collection Time: 09/21/14  4:01 PM  Result Value Ref Range   Tricyclic, Ur Screen NONE DETECTED NONE DETECTED   Amphetamines, Ur Screen NONE DETECTED NONE DETECTED   MDMA (Ecstasy)Ur Screen NONE DETECTED NONE DETECTED   Cocaine Metabolite,Ur [place] NONE DETECTED NONE DETECTED   Opiate, Ur Screen NONE DETECTED NONE DETECTED   Phencyclidine (PCP) Ur S NONE DETECTED NONE DETECTED   Cannabinoid 50 Ng, Ur [place] NONE DETECTED NONE DETECTED   Barbiturates, Ur Screen NONE DETECTED NONE DETECTED   Benzodiazepine, Ur Scrn NONE DETECTED NONE DETECTED   Methadone Scn, Ur NONE DETECTED NONE DETECTED    Comment: (NOTE) 423  Tricyclics, urine               Cutoff 1000 ng/mL 200  Amphetamines, urine             Cutoff 1000 ng/mL 300  MDMA (Ecstasy), urine           Cutoff 500 ng/mL 400  Cocaine Metabolite, urine       Cutoff 300  ng/mL 500  Opiate, urine                   Cutoff 300 ng/mL 600  Phencyclidine (PCP), urine      Cutoff 25 ng/mL 700  Cannabinoid, urine              Cutoff 50 ng/mL 800  Barbiturates, urine             Cutoff 200 ng/mL 900  Benzodiazepine, urine           Cutoff 200 ng/mL 1000 Methadone, urine                Cutoff 300 ng/mL 1100 1200 The urine drug screen  provides only a preliminary, unconfirmed 1300 analytical test result and should not be used for non-medical 1400 purposes. Clinical consideration and professional judgment should 1500 be applied to any positive drug screen result due to possible 1600 interfering substances. A more specific alternate chemical method 1700 must be used in order to obtain a confirmed analytical result.  1800 Gas chromato graphy / mass spectrometry (GC/MS) is the preferred 1900 confirmatory method.   Acetaminophen level     Status: Abnormal   Collection Time: 09/21/14  4:50 PM  Result Value Ref Range   Acetaminophen (Tylenol), Serum <10 (L) 10 - 30 ug/mL    Comment:        THERAPEUTIC CONCENTRATIONS VARY SIGNIFICANTLY. A RANGE OF 10-30 ug/mL MAY BE AN EFFECTIVE CONCENTRATION FOR MANY PATIENTS. HOWEVER, SOME ARE BEST TREATED AT CONCENTRATIONS OUTSIDE THIS RANGE. ACETAMINOPHEN CONCENTRATIONS >150 ug/mL AT 4 HOURS AFTER INGESTION AND >50 ug/mL AT 12 HOURS AFTER INGESTION ARE OFTEN ASSOCIATED WITH TOXIC REACTIONS.   CBC     Status: Abnormal   Collection Time: 09/21/14  4:50 PM  Result Value Ref Range   WBC 4.8 3.6 - 11.0 K/uL   RBC 3.88 3.80 - 5.20 MIL/uL   Hemoglobin 12.0 12.0 - 16.0 g/dL   HCT 37.3 35.0 - 47.0 %   MCV 96.1 80.0 - 100.0 fL   MCH 30.9 26.0 - 34.0 pg   MCHC 32.1 32.0 - 36.0 g/dL   RDW 14.6 (H) 11.5 - 14.5 %   Platelets 404 150 - 440 K/uL  Comprehensive metabolic panel     Status: Abnormal   Collection Time: 09/21/14  4:50 PM  Result Value Ref Range   Sodium 141 135 - 145 mmol/L   Potassium 3.0 (L) 3.5 - 5.1 mmol/L    Chloride 101 101 - 111 mmol/L   CO2 30 22 - 32 mmol/L   Glucose, Bld 117 (H) 65 - 99 mg/dL   BUN <5 (L) 6 - 20 mg/dL   Creatinine, Ser 0.59 0.44 - 1.00 mg/dL   Calcium 8.6 (L) 8.9 - 10.3 mg/dL   Total Protein 7.4 6.5 - 8.1 g/dL   Albumin 4.0 3.5 - 5.0 g/dL   AST 23 15 - 41 U/L   ALT 13 (L) 14 - 54 U/L   Alkaline Phosphatase 75 38 - 126 U/L   Total Bilirubin 0.2 (L) 0.3 - 1.2 mg/dL   GFR calc non Af Amer >60 >60 mL/min   GFR calc Af Amer >60 >60 mL/min    Comment: (NOTE) The eGFR has been calculated using the CKD EPI equation. This calculation has not been validated in all clinical situations. eGFR's persistently <60 mL/min signify possible Chronic Kidney Disease.    Anion gap 10 5 - 15  Ethanol (ETOH)     Status: None   Collection Time: 09/21/14  4:50 PM  Result Value Ref Range   Alcohol, Ethyl (B) <5 <5 mg/dL    Comment:        LOWEST DETECTABLE LIMIT FOR SERUM ALCOHOL IS 5 mg/dL FOR MEDICAL PURPOSES ONLY   Salicylate level     Status: None   Collection Time: 09/21/14  4:50 PM  Result Value Ref Range   Salicylate Lvl <7.6 2.8 - 30.0 mg/dL    Vitals: Blood pressure 157/85, pulse 80, temperature 97.8 F (36.6 C), temperature source Oral, resp. rate 20, height _0  (1.626 m), weight 79.379 kg (175 lb), SpO2 99 %.  Risk to Self: Suicidal Ideation:  (Unable to Assess;  pt refused to talk) Suicidal Intent:  (Unable to Assess; pt refused to talk) Is patient at risk for suicide?:  (Unable to Assess; pt refused to talk) Suicidal Plan?:  (Unable to Assess; pt refused to talk) Access to Means:  (Unable to Assess; pt refused to talk) What has been your use of drugs/alcohol within the last 12 months?:  (Unable to Assess; pt refused to talk) Other Self Harm Risks:  (Unable to Assess; pt refused to talk) Triggers for Past Attempts:  (Unable to Assess; pt refused to talk) Intentional Self Injurious Behavior:  (Unable to Assess; pt refused to talk) Risk to Others: Homicidal Ideation:   (Unable to Assess; pt refused to talk) Thoughts of Harm to Others:  (Unable to Assess; pt refused to talk) Current Homicidal Intent:  (Unable to Assess; pt refused to talk) Current Homicidal Plan:  (Unable to Assess; pt refused to talk) Access to Homicidal Means:  (Unable to Assess; pt refused to talk) Identified Victim:  (Unable to Assess; pt refused to talk) History of harm to others?:  (Unable to Assess; pt refused to talk) Assessment of Violence:  (Unable to Assess; pt refused to talk) Violent Behavior Description:  (Unable to Assess; pt refused to talk) Does patient have access to weapons?:  (Unable to Assess; pt refused to talk) Criminal Charges Pending?:  (Unable to Assess; pt refused to talk) Does patient have a court date:  (Unable to Assess; pt refused to talk) Prior Inpatient Therapy: Prior Inpatient Therapy:  (Unable to Assess; pt refused to talk) Prior Therapy Dates:  (Unable to Assess; pt refused to talk) Prior Therapy Facilty/Provider(s):  (Unable to Assess; pt refused to talk) Reason for Treatment:  (Unable to Assess; pt refused to talk) Prior Outpatient Therapy: Prior Outpatient Therapy:  (Unable to Assess; pt refused to talk) Prior Therapy Dates:  (Unable to Assess; pt refused to talk) Prior Therapy Facilty/Provider(s):  (Unable to Assess; pt refused to talk) Reason for Treatment:  (Unable to Assess; pt refused to talk) Does patient have an ACCT team?: Unknown Does patient have Intensive In-House Services?  : No Does patient have [facility] services? : Unknown Does patient have [facility] services?: Unknown  Current Facility-Administered Medications  Medication Dose Route Frequency Provider Last Rate Last Dose  . diphenhydrAMINE (BENADRYL) capsule 50 mg  50 mg Oral QHS PRN Loney Hering, MD   25 mg at 09/22/14 2157  . haloperidol (HALDOL) tablet 2 mg  2 mg Oral BID Dewain Penning, MD   2 mg at 09/22/14 2157  . LORazepam (ATIVAN) tablet 1 mg  1 mg Oral Q4H PRN Nance Pear, MD      . potassium chloride SA (K-DUR,KLOR-CON) CR tablet 20 mEq  20 mEq Oral BID Hinda Kehr, MD   20 mEq at 09/23/14 1034   No current outpatient prescriptions on file.    Musculoskeletal: Strength & Muscle Tone: within normal limits Gait & Station: normal Patient leans: N/A  Psychiatric Specialty Exam: Physical Exam  Constitutional: She appears well-developed and well-nourished.  HENT:  Head: Normocephalic and atraumatic.    Eyes: Conjunctivae are normal. Pupils are equal, round, and reactive to light.  Neck: Normal range of motion.  Cardiovascular: Normal heart sounds.   Respiratory: Effort normal.  GI: Soft.  Musculoskeletal: Normal range of motion.  Neurological: She is alert.  Skin: Skin is warm and dry.  Psychiatric: Her affect is blunt. Her speech is tangential. She is withdrawn. Thought content is paranoid and delusional. Cognition and memory  are impaired. She expresses inappropriate judgment. She is noncommunicative.    Review of Systems  Constitutional: Negative.   HENT: Positive for congestion.   Eyes: Negative.   Respiratory: Negative.   Cardiovascular: Negative.   Gastrointestinal: Negative.   Musculoskeletal: Negative.   Skin: Negative.   Neurological: Negative.   Psychiatric/Behavioral: Positive for hallucinations. Negative for depression, suicidal ideas, memory loss and substance abuse. The patient is not nervous/anxious and does not have insomnia.     Blood pressure 157/85, pulse 80, temperature 97.8 F (36.6 C), temperature source Oral, resp. rate 20, height _0  (1.626 m), weight 79.379 kg (175 lb), SpO2 99 %.Body mass index is 30.02 kg/(m^2).  General Appearance: Disheveled  Eye Sport and exercise psychologist::  Fair  Speech:  Slow  Volume:  Decreased  Mood:  Anxious  Affect:  Blunt and Flat  Thought Process:  Disorganized and Loose  Orientation:  Full (Time, Place, and Person)  Thought Content:  Delusions, Hallucinations: Auditory, Ideas of Reference:    Paranoia and Paranoid Ideation  Suicidal Thoughts:  No  Homicidal Thoughts:  No  Memory:  Immediate;   Good Recent;   Fair Remote;   Poor  Judgement:  Impaired  Insight:  Lacking  Psychomotor Activity:  Decreased  Concentration:  Poor  Recall:  Poor  Fund of Knowledge:Good  Language: Good  Akathisia:  No  Handed:  Right  AIMS (if indicated):     Assets:  Physical Health  ADL's:  Intact  Cognition: WNL  Sleep:      Medical Decision Making: New problem, with additional work up planned, Review of Psycho-Social Stressors (1), Review or order clinical lab tests (1), Decision to obtain old records (1), Review and summation of old records (2), Review of Medication Regimen & Side Effects (2) and Review of New Medication or Change in Dosage (2)  Treatment Plan Summary: Medication management and Plan Although the patient is not able to give much history everything about her presentation is most consistent with schizophrenia. She has disorganized thought with evidence of paranoia and ideas of reference in her speaking. There is no evidence that she has been abusing drugs or alcohol. Her drug screen and alcohol screen are all normal. Physically I note that her blood pressure has been consistently running a little bit high. She also does appear to have some nasal congestion which could be an infection or could be allergies. Any outside collateral history or family history. Patient requires hospital level treatment to improve her psychosis to make it possible for her to take care of herself. Patient will be admitted to the psychiatry ward. IVC continued. I'm going to start a low dose of lisinopril for blood pressure. So far she has refused Haldol. I'm going to discontinue that and start Risperdal M tabs 1 mg twice a day. Further decisions can be made by the team downstairs.  Plan:  Recommend psychiatric Inpatient admission when medically cleared. Disposition: Continue IVC. Orders as above. Admit to  psychiatry  Alethia Berthold 09/23/2014 12:29 PM

## 2014-09-23 NOTE — ED Notes (Signed)
Meal was given to patient.. 

## 2014-09-23 NOTE — ED Notes (Signed)
Patient refused vitals  and shower due to people watching her and taking her insides states the patient.

## 2014-09-23 NOTE — ED Notes (Signed)
Patient continues delusional and paranoid. Offered juice, thinks nurse puts bugs in it. Does visit with other patients and alternates resting in room and sitting in day room. Appears calm.
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
## 2014-09-23 NOTE — ED Notes (Signed)

## 2014-09-23 NOTE — ED Provider Notes (Signed)
-----------------------------------------   6:28 AM on 09/23/2014 -----------------------------------------   BP 157/85 mmHg  Pulse 80  Temp(Src) 97.8 F (36.6 C) (Oral)  Resp 20  Ht 5\' 4"  (1.626 m)  Wt 175 lb (79.379 kg)  BMI 30.02 kg/m2  SpO2 99%  The patient had no acute events since last update.  Calm and cooperative at this time.  Disposition is pending per Psychiatry/Behavioral Medicine team recommendations.     Irean HongJade J Luie Laneve, MD 09/23/14 [phone]

## 2014-09-23 NOTE — ED Notes (Signed)
Pt given snack tray. 

## 2014-09-24 DIAGNOSIS — F209 Schizophrenia, unspecified: Secondary | ICD-10-CM

## 2014-09-24 LAB — HEMOGLOBIN A1C: HEMOGLOBIN A1C: 5.5 % (ref 4.0–6.0)

## 2014-09-24 MED ORDER — RISPERIDONE 1 MG PO TBDP
2.0000 mg | ORAL_TABLET | Freq: Two times a day (BID) | ORAL | Status: DC
  Filled 2014-09-24 (×2): qty 1

## 2014-09-24 MED ORDER — RISPERIDONE 2 MG PO TBDP: 3.0000 mg | ORAL_TABLET | Freq: Every day | ORAL | Status: DC

## 2014-09-24 MED ORDER — PSEUDOEPHEDRINE HCL ER 120 MG PO TB12
120.0000 mg | ORAL_TABLET | Freq: Two times a day (BID) | ORAL | Status: AC
  Administered 2014-09-25 (×2): 120 mg via ORAL
  Filled 2014-09-24 (×4): qty 1

## 2014-09-24 MED ORDER — DIPHENHYDRAMINE HCL 25 MG PO CAPS
50.0000 mg | ORAL_CAPSULE | Freq: Every day | ORAL | Status: DC
  Filled 2014-09-24: qty 2

## 2014-09-24 MED ORDER — RISPERIDONE 1 MG PO TBDP
3.0000 mg | ORAL_TABLET | Freq: Every day | ORAL | Status: DC
  Administered 2014-09-24 – 2014-09-25 (×2): 3 mg via ORAL
  Filled 2014-09-24 (×2): qty 3

## 2014-09-24 MED ORDER — LORATADINE 10 MG PO TABS
10.0000 mg | ORAL_TABLET | Freq: Every day | ORAL | Status: DC
  Administered 2014-09-28 – 2014-10-29 (×28): 10 mg via ORAL
  Filled 2014-09-24 (×33): qty 1

## 2014-09-24 NOTE — Progress Notes (Signed)
D: Patient walking around on unit  With a pair of under panties on her head . Patient very disorganized with  Her thoughts . Repeats coming to nursing station multiple  Times during shift  Unable to clearly stat her needs . Appetite good voice no concerns around sleep. Paranoid /Dellusional . Interacting with peers and staff. Limited participation with unit programming. A: Redirections and orient patient to time and place on the unit . Encourage patient to  Come to nursing  Staff for any concerns .

## 2014-09-24 NOTE — Plan of Care (Signed)
Problem: Ineffective individual coping Goal: LTG: Patient will report a decrease in negative feelings Outcome: Not Progressing Delusional at present cognitive concerns

## 2014-09-24 NOTE — Plan of Care (Signed)
Problem: Ineffective individual coping Goal: LTG: Patient will report a decrease in negative feelings Outcome: Not Progressing Remains psychosis

## 2014-09-24 NOTE — Progress Notes (Signed)
[facility] LCSW Group Therapy  09/24/2014 1:04 PM  Type of Therapy:  Group Therapy  Participation Level:  Active  Participation Quality:  Appropriate  Affect:  Appropriate  Cognitive:  Appropriate  Insight:  Developing/Improving  Engagement in Therapy:  Developing/Improving  Modes of Intervention:  Discussion, Education and Support  Summary of Progress/Problems:LCSW introduced group rules, this group therapy session was a small group each patient was able to reflect on emotional regulation issues and how they can resolve issues when their emotional state is unbalanced or reactional. This patient was able to share her views and was supportive to her peers.  Johnella MoloneyBandi, Ambermarie Honeyman M 09/24/2014, 1:04 PM

## 2014-09-24 NOTE — Progress Notes (Signed)
Recreation Therapy Notes  INPATIENT RECREATION THERAPY ASSESSMENT  Patient Details Name: Gloria MarketChiquita D Gaines-Jones MRN: [id number] DOB: 04/30/1970 Today's Date: 09/24/2014  Patient Stressors: Death, Other (Comment) (Worry about safety of family and friends)  Coping Skills:   Exercise, Art/Dance, Music, Other (Comment) (Read)  Personal Challenges:  Patient reported no personal challenges  Leisure Interests (2+):  Music - Listen, Individual - Other (Comment) (Reading)  Awareness of Community Resources:  Yes  Community Resources:  [employer]  Current Use: No  If no, Barriers?: Other (Comment) (Doesn't know if they have program for her)  Patient Strengths:  beautiful, personality  Patient Identified Areas of Improvement:  No  Current Recreation Participation:  "I don't really do anything for fun."  Patient Goal for Hospitalization:  Wellness  [place]  County [place] Residence:      Current SI (including self-harm):  No  Current HI:  No  Consent to Intern Participation: N/A  Due to patient reporting no personal challenges, LRT will not develop a Recreational Therapy Care Plan at this time. If patient's status changes, LRT will develop a Recreational Therapy Care Plan.  Jacquelynn CreeGreene,Yenty Bloch M, LRT/CTRS 09/24/2014, 11:52 AM

## 2014-09-24 NOTE — Progress Notes (Signed)
Patient requested Benadryl and MD ordered  . Patient agreed to take it but when the medication was opened, pt refused it stating "that is not the dose I usually take and your water is unsafe, I am not going to take it, I don't trust your water...". Patient walked back to her room. Staff continue to monitor.

## 2014-09-24 NOTE — Tx Team (Addendum)
Initial Interdisciplinary Treatment Plan   PATIENT STRESSORS: Medication change or noncompliance   PATIENT STRENGTHS: Communication skills General fund of knowledge   PROBLEM LIST: Problem List/Patient Goals Date to be addressed Date deferred Reason deferred Estimated date of resolution  Delusions 09/23/2014     paranoia 09/23/2014     irritability 09/23/2014     Medication non compliance 09/23/2014     Disturbed thought process 09/23/2014                              DISCHARGE CRITERIA:  Ability to meet basic life and health needs Improved stabilization in mood, thinking, and/or behavior Motivation to continue treatment in a less acute level of care Verbal commitment to aftercare and medication compliance  PRELIMINARY DISCHARGE PLAN: Attend aftercare/continuing care group Outpatient therapy  PATIENT/FAMIILY INVOLVEMENT: This treatment plan has been presented to and reviewed with the patient, Lorelee MarketChiquita D Gaines-Jones.  The patient and family have been given the opportunity to ask questions and make suggestions.  Olin PiaByungura, Apolo Cutshaw 09/24/2014, 4:03 AM

## 2014-09-24 NOTE — Tx Team (Signed)
Interdisciplinary Treatment Plan Update (Adult)  Date:  09/24/2014 Time Reviewed:  4:40 PM  Progress in Treatment: Attending groups: No. Participating in groups:  No. Taking medication as prescribed:  yes Tolerating medication:  Yes. Family/Significant othe contact made:  No, will contact:    Patient understands diagnosis:  No. Discussing patient identified problems/goals with staff:  Yes. Medical problems stabilized or resolved:  Yes. Denies suicidal/homicidal ideation: yes Issues/concerns per patient self-inventory:  No. Other:  New problem(s) identified: No, Describe:     Discharge Plan or Barriers:TBD, Pt is poor historian and unable to participate much in interview.  Reason for Continuation of Hospitalization: Delusions  Other; describe Disorganization, paranoid, Psychosis.  Comments:43 y.o. female with no known medical history who presented in the custody of the police with an IVC in place (7/09). They IVC'd her for bizarre and paranoid statements and behavior.She states that people have been following her, that she is a [place] for the government, her grandmother is missing, she does not participate in recycling programs. When ER doctor attempted to listen to her lungs, she pointed out that her lungs are here, pointing at her right shoulder.   Estimated length of stay:up to 7 days  New goal(s):  Review of initial/current patient goals per problem list:   See Plan of Care  Attendees: Patient:  Gloria Frank 7/12/20164:40 PM  Family:   7/12/20164:40 PM  Physician:  Ardyth HarpsHernandez 7/12/20164:40 PM  Nursing:   Hulan AmatoGwen Farrish, RN 7/12/20164:40 PM  Case Manager:   7/12/20164:40 PM  Counselor:   7/12/20164:40 PM  Other:  Jake SharkSara Italia Wolfert, LCSW 7/12/20164:40 PM  Other:  Beryl MeagerJason Ingle, LCSWA 7/12/20164:40 PM  Other:  Hershal CoriaBeth Greene LRT 7/12/20164:40 PM  Other:  7/12/20164:40 PM  Other:  7/12/20164:40 PM  Other:  7/12/20164:40 PM  Other:  7/12/20164:40 PM  Other:   7/12/20164:40 PM  Other:  7/12/20164:40 PM  Other:   7/12/20164:40 PM   Scribe for Treatment Team:   Glennon MacLaws, Sydney Hasten P, 09/24/2014, 4:40 PM

## 2014-09-24 NOTE — [facility] Group Notes (Signed)
[facility] Group Notes:  (Nursing/MHT/Case Management/Adjunct)  Date:  09/24/2014  Time:  2:24 PM  Type of Therapy:  Psychoeducational Skills  Participation Level:  Did Not Attend   Jamien Casanova Travis Jessi Pitstick 09/24/2014, 2:24 PM 

## 2014-09-24 NOTE — Progress Notes (Signed)
Recreation Therapy Notes  Date: 07.12.16 Time: 3:15 Location: Craft Room  Group Topic: Self-expression  Goal Area(s) Addresses:  Patient will identify one color per emotion listed on wheel. Patient will verbalize benefit of using art as a means of self-expression. Patient will verbalize one emotion experienced during session. Patient will be educated on other forms of self-expression.  Behavioral Response: Off topic  Intervention: Emotion Wheel  Activity: Patients were given a worksheet with 7 emotions on it. Patients were instructed to pick a color for each emotion.  Education: LRT educated patients on different forms of self-expression.  Education Outcome: Acknowledges education/In group clarification offered  Clinical Observations/Feedback: Patient did not work on activity. Patient asked questions and LRT explained the activity but patient would not participate. Patient did not contribute to group discussion. Patient asked another patient about his worksheet and they started talking about blood and the color of blood. LRT redirected patients.  Jacquelynn CreeGreene,Jaivion Kingsley M, LRT/CTRS 09/24/2014 4:24 PM

## 2014-09-24 NOTE — Progress Notes (Signed)
LCSW met with patient at 9:30am and again   At 1:45pm to complete assessment plan. Patient sat done with LCSW and refused to participate and requested a lawyer. She also requested she will only speak with assigned SW. LCSW called other Social worker and let her know patients wishes and informed staff

## 2014-09-24 NOTE — Progress Notes (Addendum)
[facility] MD Progress Note  09/25/2014 11:33[facility] AM Lorelee MarketChiquita D Gaines-Jones  MRN:  [id number] Subjective:  Per nursing :Patient walking around on unit with a pair of under panties on her head . Patient very disorganized with Her thoughts . Repeats coming to nursing station multiple Times during shift Unable to clearly state her needs  Paranoid /Dellusional . Interacting with peers and staff. Limited participation with unit programming.    Patient refused medications yesterday morning and stated that she was only willing to take the Risperdal at bedtime she requested to take 3 mg. She did comply with it last night.  She refused to have his blood drawn for some of the labs that were ordered such as B12.  Thoughts continue to be very disorganized. Patient has been using the wrong words during her communications is very hard to understand her concerns or statements.  No evidence of agitation or aggression.  This morning she was found asleep in late in the morning. She hardly talk to me today. She denied having any concerns per staff there are no issues with oral intake or sleep. She did comply with medicines last night.   Principal Problem: Schizophrenia, unspecified type Diagnosis:   Patient Active Problem List   Diagnosis Date Noted  . Schizophrenia, unspecified type [F20.9] 09/24/2014  . High blood pressure [I10] 09/23/2014   Total Time spent with patient: 30 minutes   Past Medical History: History reviewed. No pertinent past medical history. History reviewed. No pertinent past surgical history. Family History: History reviewed. No pertinent family history. Social History:  History  Alcohol Use No     History  Drug Use No    History   Social History  . Marital Status: Married    Spouse Name: N/A  . Number of Children: N/A  . Years of Education: N/A   Social History Main Topics  . Smoking status: Never Smoker   . Smokeless tobacco: Not on file  . Alcohol Use: No  . Drug Use: No  .  Sexual Activity: Not on file   Other Topics Concern  . None   Social History Narrative   Additional History:    Sleep: Good  Appetite:  Good   Assessment:   Musculoskeletal: Strength & Muscle Tone: within normal limits Gait & Station: normal Patient leans: N/A   Psychiatric Specialty Exam: Physical Exam  Review of Systems  Constitutional: Negative.   HENT: Positive for congestion.   Eyes: Negative.   Respiratory: Positive for cough.   Cardiovascular: Negative.   Gastrointestinal: Negative.   Genitourinary: Negative.   Musculoskeletal: Negative.   Skin: Negative.   Neurological: Negative.   Endo/Heme/Allergies: Negative.   Psychiatric/Behavioral: Positive for depression. Negative for suicidal ideas, hallucinations and substance abuse. The patient is not nervous/anxious and does not have insomnia.     Blood pressure 112/77, pulse 83, temperature 98.1 F (36.7 C), temperature source Oral, resp. rate 20, height 5\' 4"  (1.626 m), weight 79.379 kg (175 lb), SpO2 100 %.Body mass index is 30.02 kg/(m^2).  General Appearance: Fairly Groomed  Patent attorneyye Contact::  Good  Speech:  Clear and Coherent  Volume:  Normal  Mood:  Euthymic  Affect:  Congruent  Thought Process:  Irrelevant, Loose and Tangential  Orientation:  Full (Time, Place, and Person)  Thought Content:  Hallucinations: None  Suicidal Thoughts:  No  Homicidal Thoughts:  No  Memory:  Immediate;   Fair Recent;   Fair Remote;   Fair  Judgement:  Impaired  Insight:  Lacking  Psychomotor Activity:  Normal  Concentration:  Fair  Recall:  NA  Fund of Knowledge:Fair  Language: Fair  Akathisia:  No  Handed:    AIMS (if indicated):     Assets:  Financial Resources/Insurance Vocational/Educational  ADL's:  Intact  Cognition: WNL  Sleep:  Number of Hours: 4.75     Current Medications: Current Facility-Administered Medications  Medication Dose Route Frequency Provider Last Rate Last Dose  . acetaminophen  (TYLENOL) tablet 650 mg  650 mg Oral Q6H PRN Audery Amel, MD      . alum & mag hydroxide-simeth (MAALOX/MYLANTA) 200-200-20 MG/5ML suspension 30 mL  30 mL Oral Q4H PRN Audery Amel, MD      . lisinopril (PRINIVIL,ZESTRIL) tablet 5 mg  5 mg Oral Daily Audery Amel, MD   5 mg at 09/25/14 1127  . loratadine (CLARITIN) tablet 10 mg  10 mg Oral Daily Jimmy Footman, MD   10 mg at 09/24/14 1206  . magnesium hydroxide (MILK OF MAGNESIA) suspension 30 mL  30 mL Oral Daily PRN Audery Amel, MD      . pseudoephedrine (SUDAFED) 12 hr tablet 120 mg  120 mg Oral BID Jimmy Footman, MD   120 mg at 09/25/14 0950  . risperiDONE (RISPERDAL M-TABS) disintegrating tablet 3 mg  3 mg Oral QHS Jimmy Footman, MD   3 mg at 09/24/14 2135    Lab Results:  Results for orders placed or performed during the hospital encounter of 09/23/14 (from the past 48 hour(s))  Hemoglobin A1c     Status: None   Collection Time: 09/23/14  9:48 PM  Result Value Ref Range   Hgb A1c MFr Bld 5.5 4.0 - 6.0 %  Lipid panel, fasting     Status: None   Collection Time: 09/23/14  9:48 PM  Result Value Ref Range   Cholesterol 175 0 - 200 mg/dL   Triglycerides 161 <096 mg/dL   HDL 55 >04 mg/dL   Total CHOL/HDL Ratio 3.2 RATIO   VLDL 22 0 - 40 mg/dL   LDL Cholesterol 98 0 - 99 mg/dL    Comment:        Total Cholesterol/HDL:CHD Risk Coronary Heart Disease Risk Table                     Men   Women  1/2 Average Risk   3.4   3.3  Average Risk       5.0   4.4  2 X Average Risk   9.6   7.1  3 X Average Risk  23.4   11.0        Use the calculated Patient Ratio above and the CHD Risk Table to determine the patient's CHD Risk.        ATP III CLASSIFICATION (LDL):  <100     mg/dL   Optimal  540-981  mg/dL   Near or Above                    Optimal  130-159  mg/dL   Borderline  191-478  mg/dL   High  >295     mg/dL   Very High   TSH     Status: None   Collection Time: 09/23/14  9:48 PM   Result Value Ref Range   TSH 1.979 0.350 - 4.500 uIU/mL    Physical Findings: AIMS: Facial and Oral Movements Muscles of Facial Expression: None, normal Lips and Perioral Area:  None, normal Jaw: None, normal Tongue: None, normal,Extremity Movements Upper (arms, wrists, hands, fingers): None, normal Lower (legs, knees, ankles, toes): None, normal, Trunk Movements Neck, shoulders, hips: None, normal, Overall Severity Severity of abnormal movements (highest score from questions above): None, normal Incapacitation due to abnormal movements: None, normal Patient's awareness of abnormal movements (rate only patient's report): No Awareness, Dental Status Current problems with teeth and/or dentures?: No Does patient usually wear dentures?: No  CIWA:  CIWA-Ar Total: 0 COWS:     Treatment Plan Summary:  44 year old with possible diagnosis of schizophrenia. Presented to our emergency department under petition due to bizarre behavior and paranoia. Patient is a poor historian at this time and there is no collateral information. Per records looks the patient was in our system backing 2002 for psychosis in [facility] emergency department.  Psychosis: Continue Risperdal M-tab 3 mg po qhs  Hypertension continue lisinopril 5 mg by mouth daily.  Labs: TSH wnl, lipid panel wnl.  HbA1c is pending.  Refused some of the labs ordered.  Collateral information: son confirmed diagnosis of schizophrenia. They are not very close.  He says she was living in a facility in [place].  Precautions continue every 15 minute checks  Hospitalization status continue involuntary commitment  Discharge planning: Per family pt was living in a [facility] or ALF prior to admission.   Medical Decision Making:  Established Problem, Stable/Improving (1)     Jimmy Footman 09/25/2014, 11:33 AM

## 2014-09-24 NOTE — [facility] Suicide Risk Assessment (Signed)
[facility]   Nursing information obtained from:    Demographic factors:    Current Mental Status:    Loss Factors:    Historical Factors:    Risk Reduction Factors:    Total Time spent with patient: 1 hour Principal Problem: Schizophrenia, unspecified type Diagnosis:   Patient Active Problem List   Diagnosis Date Noted  . Schizophrenia, unspecified type [F20.9] 09/24/2014  . High blood pressure [I10] 09/23/2014     Continued Clinical Symptoms:    The "Alcohol Use Disorders Identification Test", Guidelines for Use in Primary Care, Second Edition.  World Science writerHealth Organization [facility](WHO). Score between 0-7:  no or low risk or alcohol related problems. Score between 8-15:  moderate risk of alcohol related problems. Score between 16-19:  high risk of alcohol related problems. Score 20 or above:  warrants further diagnostic evaluation for alcohol dependence and treatment.   CLINICAL FACTORS:   Schizophrenia:   Paranoid or undifferentiated type Previous Psychiatric Diagnoses and Treatments    Psychiatric Specialty Exam: Physical Exam  ROS    COGNITIVE FEATURES THAT CONTRIBUTE TO RISK:  Loss of executive function    SUICIDE RISK:   Moderate:  Frequent suicidal ideation with limited intensity, and duration, some specificity in terms of plans, no associated intent, good self-control, limited dysphoria/symptomatology, some risk factors present, and identifiable protective factors, including available and accessible social support.  PLAN OF CARE: admit to bh   Medical Decision Making:  New problem, with additional work up planned  I certify that inpatient services furnished can reasonably be expected to improve the patient's condition.   Jimmy FootmanHernandez-Gonzalez,  Shanna Strength 09/24/2014, 11:19 AM

## 2014-09-24 NOTE — Progress Notes (Signed)
LCSW unable  to complete assessment due to paranoia and acuity at this time.

## 2014-09-24 NOTE — H&P (Addendum)
Psychiatric Admission Assessment Adult  Patient Identification: Gloria Frank MRN:  [id number] Date of Evaluation:  09/24/2014 Chief Complaint:  Schizophrenia Principal Diagnosis: Schizophrenia Diagnosis:   Patient Active Problem List   Diagnosis Date Noted  . Schizophrenia [F20.9] 09/23/2014  . High blood pressure [I10] 09/23/2014   History of Present Illness: Gloria Frank is a 44 y.o. female with no known medical history who presented in the custody of the police with an IVC in place (7/09). They IVC'd her for bizarre and paranoid statements and behavior.She states that people have been following her, that she is a [place] for the government, her grandmother is missing, she does not participate in recycling programs. When ER doctor  attempted to listen to her lungs, she pointed out that her lungs are here, pointing at her right shoulder.   Per the ER psychiatrist the patient was not even able to give a reliable description of the events leading to hospitalization. She told a rambling story about how she had been living in [place] and then living in [place]. Shecouldn't describe why police were approaching her or what transpired when they approached her. Every time she started to describe the situation and she derailed  into bizarre nonsensical statements. The patient denied  abusing any alcohol or drugs. She claimed she is not taking any prescription medicine.   Per records patient [facility] department back in 2012 due to psychosis. Labs were available, she was not positive for any illicit substances. Also her basic metabolic panel and CBC were within the normal limits. She reported being treated with risperidone.  Today patient was interviewed. She is a very limited historian. There is frequent derailment in her thought process. She does not answer questions about him is feeling even with significant redirection. Patient gave me the  telephone number for her son and gave me permission to contact him [phone] his name is  Jess BartersXavier   Substance abuse history: She denies that she drinks or uses any street drugs and also denies smoking. Says that she's never had a drug or alcohol problem in the past.  Current medications unknown HPI Elements: Quality: Disorganized psychosis with evidence of paranoia and delusions. Severity: Severe and impairing her ability to take care of herself. Timing: Unknown probably chronic. Duration: Still going strong after being in the emergency room for a day. Context: Unknown context with probably noncompliant with treatment.  Total Time spent with patient: 1 hour   Past psychiatric history: Patient denies that she's ever been in a psychiatric hospital before but when I named several of them she clearly recognize them. Old records show that she's had at least one prior visit to an emergency room in our system at which time she was also identified as having psychotic symptoms. It was mentioned them that she had been on Risperdal in the past. Patient denies any history of suicide attempts or violent behavior. Won't name any other medications that she has taken.  Patient claims that she has been diagnosed with depression in the past.   Past Medical History: Patient denies any significant ongoing medical problems but acutely says that she has a sinus infection. It is causing nasal congestion. Denies that she is on any medication.  Family History: not known  Social History: It is unclear where she might be living. She does me that she has been traveling around for several months. This may or may not be true. She won't tell me an address where  she lives. Mentions both [place] and [place] and being here in [place]. She won't tell me anything about specific relatives that she stays in touch with. She claims to have 4 children but says they are all living in different places "scattered".  Doesn't appear to be working. Unclear whether she gets disability. Her jaundice child is 78 years old, her daughter. She lives with him the patient's ex-husband in [place]. She has other 3 children were grown. The patient states that she never divorced her husband but she tells me she is now remarried. Patient mentioned that she has been at the shelter in [place] and also in jail in that same area however she stated that this is being several years ago. I when I ask her about disability she says she does not know whether she is on disability or not she thinks she gets money from a payroll but could not tell me whether she is working or not. History  Alcohol Use No     History  Drug Use No    History   Social History  . Marital Status: Married    Spouse Name: N/A  . Number of Children: N/A  . Years of Education: N/A   Social History Main Topics  . Smoking status: Never Smoker   . Smokeless tobacco: Not on file  . Alcohol Use: No  . Drug Use: No  . Sexual Activity: Not on file   Other Topics Concern  . None   Social History Narrative     Musculoskeletal: Strength & Muscle Tone: within normal limits Gait & Station: normal Patient leans: N/A  Psychiatric Specialty Exam: Physical Exam  Review of Systems  Constitutional: Negative.   HENT: Positive for congestion.   Eyes: Negative.   Respiratory: Positive for cough.   Cardiovascular: Negative.   Gastrointestinal: Negative.   Genitourinary: Negative.   Musculoskeletal: Negative.   Neurological: Negative.   Endo/Heme/Allergies: Negative.   Psychiatric/Behavioral: Positive for depression. Negative for suicidal ideas, hallucinations, memory loss and substance abuse. The patient is not nervous/anxious and does not have insomnia.     Blood pressure 112/77, pulse 83, temperature 98.1 F (36.7 C), temperature source Oral, resp. rate 20, height 5\' 4"  (1.626 m), weight 79.379 kg (175 lb), SpO2 100 %.Body mass index  is 30.02 kg/(m^2).  General Appearance: Fairly Groomed  Patent attorney::  Good  Speech:  Clear and Coherent  Volume:  Normal  Mood:  Euthymic  Affect:  Appropriate and Congruent  Thought Process:  Loose and Tangential  Orientation:  Full (Time, Place, and Person)  Thought Content:  Hallucinations: None  Suicidal Thoughts:  No  Homicidal Thoughts:  No  Memory:  Immediate;   Fair Recent;   Fair Remote;   Fair  Judgement:  Impaired  Insight:  Lacking  Psychomotor Activity:  Normal  Concentration:  Poor  Recall:  NA  Fund of Knowledge:Fair  Language: Good  Akathisia:  No  Handed:    AIMS (if indicated):     Assets:  Communication Skills  ADL's:  Intact  Cognition: WNL  Sleep:      Physical examination: Constitutional: Alert and oriented initially. Well appearing and in no acute distress. Eyes: Conjunctivae are normal. PERRL. EOMI. Head: Atraumatic. Nose: Mild rhinorrhea Mouth/Throat: Mucous membranes are moist. Patient does not want me to look in her throat. Neck: No stridor.  Cardiovascular: Normal rate, regular rhythm. Grossly normal heart sounds. Good peripheral circulation. Respiratory: Normal respiratory effort. No retractions. Lungs CTAB. Gastrointestinal:  Soft and nontender. No distention. No abdominal bruits. No CVA tenderness. Musculoskeletal: No lower extremity tenderness nor edema. No joint effusions. Neurologic: Normal speech and language. No gross focal neurologic deficits are appreciated. Speech is normal. Ambulating well Skin: Skin is warm, dry and intact. No rash noted. Psychiatric: Odd affect with paranoia, tangential conversation that has nothing to do with questions asked, bizarre, seemingly random statements. ____________________________________________    Allergies:  No Known Allergies   Lab Results:  Results for orders placed or performed during the hospital encounter of 09/23/14 (from the past 48 hour(s))  Lipid panel, fasting     Status:  None   Collection Time: 09/23/14  9:48 PM  Result Value Ref Range   Cholesterol 175 0 - 200 mg/dL   Triglycerides 161 <096 mg/dL   HDL 55 >04 mg/dL   Total CHOL/HDL Ratio 3.2 RATIO   VLDL 22 0 - 40 mg/dL   LDL Cholesterol 98 0 - 99 mg/dL    Comment:        Total Cholesterol/HDL:CHD Risk Coronary Heart Disease Risk Table                     Men   Women  1/2 Average Risk   3.4   3.3  Average Risk       5.0   4.4  2 X Average Risk   9.6   7.1  3 X Average Risk  23.4   11.0        Use the calculated Patient Ratio above and the CHD Risk Table to determine the patient's CHD Risk.        ATP III CLASSIFICATION (LDL):  <100     mg/dL   Optimal  540-981  mg/dL   Near or Above                    Optimal  130-159  mg/dL   Borderline  191-478  mg/dL   High  >295     mg/dL   Very High   TSH     Status: None   Collection Time: 09/23/14  9:48 PM  Result Value Ref Range   TSH 1.979 0.350 - 4.500 uIU/mL   Current Medications: Current Facility-Administered Medications  Medication Dose Route Frequency Provider Last Rate Last Dose  . acetaminophen (TYLENOL) tablet 650 mg  650 mg Oral Q6H PRN Audery Amel, MD      . alum & mag hydroxide-simeth (MAALOX/MYLANTA) 200-200-20 MG/5ML suspension 30 mL  30 mL Oral Q4H PRN Audery Amel, MD      . diphenhydrAMINE (BENADRYL) capsule 50 mg  50 mg Oral QHS Jolanta B Pucilowska, MD   50 mg at 09/24/14 0044  . lisinopril (PRINIVIL,ZESTRIL) tablet 5 mg  5 mg Oral Daily Audery Amel, MD   5 mg at 09/24/14 1034  . magnesium hydroxide (MILK OF MAGNESIA) suspension 30 mL  30 mL Oral Daily PRN Audery Amel, MD      . risperiDONE (RISPERDAL M-TABS) disintegrating tablet 1 mg  1 mg Oral BID Audery Amel, MD   1 mg at 09/23/14 2245   PTA Medications: No prescriptions prior to admission      Results for orders placed or performed during the hospital encounter of 09/23/14 (from the past 72 hour(s))  Lipid panel, fasting     Status: None   Collection  Time: 09/23/14  9:48 PM  Result Value Ref Range   Cholesterol 175  0 - 200 mg/dL   Triglycerides 161 <096 mg/dL   HDL 55 >04 mg/dL   Total CHOL/HDL Ratio 3.2 RATIO   VLDL 22 0 - 40 mg/dL   LDL Cholesterol 98 0 - 99 mg/dL    Comment:        Total Cholesterol/HDL:CHD Risk Coronary Heart Disease Risk Table                     Men   Women  1/2 Average Risk   3.4   3.3  Average Risk       5.0   4.4  2 X Average Risk   9.6   7.1  3 X Average Risk  23.4   11.0        Use the calculated Patient Ratio above and the CHD Risk Table to determine the patient's CHD Risk.        ATP III CLASSIFICATION (LDL):  <100     mg/dL   Optimal  540-981  mg/dL   Near or Above                    Optimal  130-159  mg/dL   Borderline  191-478  mg/dL   High  >295     mg/dL   Very High   TSH     Status: None   Collection Time: 09/23/14  9:48 PM  Result Value Ref Range   TSH 1.979 0.350 - 4.500 uIU/mL    Treatment Plan Summary: Daily contact with patient to assess and evaluate symptoms and progress in treatment and Medication management   44 year old with possible diagnosis of schizophrenia.  Presented to our emergency department under petition due to bizarre behavior and paranoia. Patient is a poor historian at this time and there is no collateral information. Per records looks the patient was in our system backing 2002 for psychosis in [facility] emergency department.  Psychosis: Continue Risperdal 1 mg by mouth twice a day  Hypertension continue lisinopril 5 mg by mouth daily.  Labs: I will order vitamin B12, TSH, RPR, and HIV.  Hemoglobin A1c will be checked as well.  Collateral information: : Patient's sons report that she was diagnosed with schizophrenia in her late 99s. All her children were removed by DSS. The patient's son stated that they're not very close. He continues his mother states in a facility in [place]. He does not know the name of the facility but states that there is a security  guard so I imagine this might be a assisted living facility. She has a long history of being noncompliant with medications.  Precautions continue every 15 minute checks  Hospitalization status continue involuntary commitment  Discharge planning: Possibly homeless my need to be discharged to a shelter.  Medical Decision Making:  New problem, with additional work up planned   >90 minutes.  Interview with the patient, discussion with treatment team, review of records and discussion with the patient's son.  I certify that inpatient services furnished can reasonably be expected to improve the patient's condition.   Jimmy Footman 7/12/201611:17 AM

## 2014-09-25 NOTE — Progress Notes (Signed)
Recreation Therapy Notes  Date: 07.12.16 Time: 3:00 pm Location: Craft Room  Group Topic: Self-esteem  Goal Area(s) Addresses:  Patient will write at least one positive trait about self. Patient will verbalize benefit of having a good self-esteem.  Behavioral Response: Did not attend  Intervention: I Am  Activity: Patients were given a worksheet with the letter I and instructed to fill the letter with positive traits about themselves.  Education: LRT educated patients on different ways to increase self-esteem   Education Outcome: Patient did not attend group.  Clinical Observations/Feedback: Patient did not attend group.  Lannis Lichtenwalner M, LRT/CTRS 09/25/2014 3:59 PM 

## 2014-09-25 NOTE — Plan of Care (Signed)
Problem: Ineffective individual coping Goal: STG-Increase in ability to manage activities of daily living Outcome: Not Progressing Patient continues to experience disorganized thinking and behavior and poor judgement

## 2014-09-25 NOTE — [facility] Group Notes (Signed)
[facility]  09/25/2014 2:44 PM  Participation Quality:  DID NOT ATTEND  Affect:    Cognitive:    Insight:    Engagement in Group:    Modes of Intervention:    Summary of Progress/Problems:  Cheron SchaumannBandi, Raylyn Speckman M 09/25/2014, 2:44 PM

## 2014-09-25 NOTE — Progress Notes (Signed)
Patient is seen in the milieu. Continues to display psychotic behavior, obsessions and delusions.  Walking back and forth in the hallways, talking to self, guarded. Safety precautions reinforced.

## 2014-09-25 NOTE — Plan of Care (Signed)
Problem: Ineffective individual coping Goal: LTG: Patient will report a decrease in negative feelings Outcome: Not Progressing Patient continues to display negative behavior, blaming on staff and minimizing nurses interventions

## 2014-09-25 NOTE — Progress Notes (Signed)
Patient ID: Gloria Frank, female   DOB: 08/05/1970, 44 y.o.   MRN: [id number]  CSW attempted to complete assessment. Pt was unable to participate due to severity of psychosis.   [name], MSW LCSWA 09/25/2014 2:25 PM

## 2014-09-25 NOTE — Progress Notes (Signed)
D: Patient denies SI/HI/AVH. Patient affect is labile. Mood is anxious.  Patient is delusional with disorganized speech.   Patient did attend evening group. Patient visible on the milieu. No distress noted. A: Support and encouragement offered. Scheduled medications given to pt. Q 15 min checks continued for patient safety. R: Patient receptive. Patient remains safe on the unit.

## 2014-09-25 NOTE — [facility] Group Notes (Signed)
[facility] Group Notes:  (Nursing/MHT/Case Management/Adjunct)  Date:  09/25/2014  Time:  3:51 PM  Type of Therapy:  Psychoeducational Skills  Participation Level:  Minimal  Participation Quality:  Intrusive and Monopolizing  Affect:  Defensive and Resistant  Cognitive:  Disorganized and Confused  Insight:  Lacking and Limited  Engagement in Group:  Lacking, Limited and Pt. left group early  Modes of Intervention:  Clarification, Discussion and Education  Summary of Progress/Problems:  Lynelle SmokeCara Travis [facility] IncMadoni 09/25/2014, 3:51 PM

## 2014-09-25 NOTE — Progress Notes (Signed)
D. Pt presents with flat affect. Mood is anxious. Pt delusional, intrusive and non-sensical. Denies SI, HI, AVH.  A. Reoriented pt , encouraged to attend group. Educated pt on importance of taking prescribed medications. R. Pt needs reinforcement, continues to be non-sensical. Did attend afternoon group. Continues with being intrusive. Safety maintained on unit with q15 min checks.

## 2014-09-25 NOTE — [facility] Group Notes (Signed)
[facility] LCSW Group Therapy  09/25/2014 2:36 PM  Type of Therapy:  Group Therapy  Participation Level:  Minimal  Participation Quality:  Resistant  Affect:  Angry  Cognitive:  Disorganized  Insight:  Resistant  Engagement in Therapy:  Monopolizing  Modes of Intervention:  Discussion  Summary of Progress/Problems::LCSW reviewed group rules with patients. LCSW reviewed the stages of growth development and the need for change. This patient was able to understand the 5 stages, Insight,Self Questioning, Self Awareness,Acknowledgement Self realization and acceptance. Patient reported that she requested 1-1 supportive group therapy, became very rude to other group members and then left. LCSW made several attempts to support patient. She was provided this workers credentials and informed herself I am most appropriate and educated to do group today. LCSW informed staff of interogative and paranoid behavior of patient  Cheron SchaumannBandi, Estefanny Moler M 09/25/2014, 2:36 PM

## 2014-09-26 MED ORDER — RISPERIDONE 1 MG PO TBDP
4.0000 mg | ORAL_TABLET | Freq: Every day | ORAL | Status: DC
  Administered 2014-09-26 – 2014-09-28 (×3): 4 mg via ORAL
  Filled 2014-09-26 (×4): qty 4

## 2014-09-26 NOTE — B[facility] Group Notes (Signed)
[facility] Group Notes:  (Nursing/MHT/Case Management/Adjunct)  Date:  09/26/2014  Time:  4:42 AM  Type of Therapy:  Group Therapy  Participation Level:  Did Not Attend   Summary of Progress/Problems:  Gloria Frank 09/26/2014, 4:42 AM
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
## 2014-09-26 NOTE — Progress Notes (Signed)
Patient remains  Disorganized  with  Thought process. Observed to talk to herself . Patient presents to nursing station with many   Concerns.  Pacing hall . No group programming Limited insight  Into concerns or behavior . Appetite good, voice no other concerns Remains paranoid and supicious  Intrusive. Appearance , continue to where panties  On her head and scrubes. Patient refused all her am medications .

## 2014-09-26 NOTE — Progress Notes (Signed)
Recreation Therapy Notes  Date: 07.14.16 Time: 3:00 pm Location: Craft Room  Group Topic: Leisure Education  Goal Area(s) Addresses:  Patient will identify activities for each letter of the alphabet. Patient will verbalize ability to integrate positive leisure into life post d/c. Patient will verbalize ability to use leisure as a coping mechanism.  Behavioral Response: Left early   Intervention: Leisure Alphabet  Activity: Patients were given a Leisure Information systems managerAlphabet worksheet and instructed to list a positive leisure activity for each letter of the alphabet.   Education: LRT educated patients on what is needed to participate in leisure activities.  Education Outcome: Patient left group before it got started.  Clinical Observations/Feedback: Patient asked, "Are we all friends here? Because I don't have friends." Patient got up and walked out of group at approximately 3:06 pm. Patient did not return to group.         Jacquelynn CreeGreene,Polk Minor M 09/26/2014 4:19 PM

## 2014-09-26 NOTE — [facility] Group Notes (Signed)
[facility] Group Notes:  (Nursing/MHT/Case Management/Adjunct)  Date:  09/26/2014  Time:  2:54 PM  Type of Therapy:  Psychoeducational Skills  Participation Level:  Did Not Attend   Lynelle SmokeCara Travis [facility] CenterMadoni 09/26/2014, 2:54 PM

## 2014-09-26 NOTE — Plan of Care (Signed)
Problem: Ineffective individual coping Goal: LTG: Patient will report a decrease in negative feelings Outcome: Progressing Remains dellusional

## 2014-09-26 NOTE — [facility] Group Notes (Signed)
[facility] Group Notes:  (Nursing/MHT/Case Management/Adjunct)  Date:  09/26/2014  Time:  9:59 PM  Type of Therapy:  Group Therapy  Participation Level:  Did Not Attend  Summary of Progress/Problems:  Gloria Frank 09/26/2014, 9:59 PM

## 2014-09-26 NOTE — [facility] Group Notes (Signed)
[facility] LCSW Group Therapy  09/26/2014 1:37 PM  Type of Therapy:  Group Therapy  Participation Level:  Minimal  Participation Quality:  Intrusive  Affect:  Defensive  Cognitive:  Disorganized  Insight:  Distracting  Engagement in Therapy:  Developing/Improving  Modes of Intervention:  Confrontation, Discussion, Education, Role-play and Support  Summary of Progress/Problems: LCSW discussed group rules. LCSW had to redirect patient as she came in last 15 minutes of group. Group was based on communication methods and she needed gentle reminding not to interrupt others. LCSW  Made her feel included but patient remains to struggle with her thoughts and continually talked out of turn and off topic. Peers were patient. It is this workers goal to make all group members participate however when she left early her peers were upset again. Patient has communicated her likes and dislikes on every subject and upsets her peers easily by her paranoia and lack of social graces. LCSW supported both this patient and other group members after group  Kesean Serviss M 09/26/2014, 1:37 PM

## 2014-09-26 NOTE — Progress Notes (Signed)
[facility]  09/26/2014 9:25 AM Lorelee MarketChiquita D Gaines-Jones  MRN:  [id number] Subjective:  Patient was interviewed today. She continues to be very disorganized and constantly is making nonsensical statements. Her questions are bizarre and is very difficult for me to follow what she is trying to say. Patient is evasive when questioned about psychotic symptoms.  She denies ever being diagnosed with schizophrenia, continues to as stated that she has only been diagnosed with depression. She denies ever being on Risperdal even though it is documented in the chart the back in 2012 she was taking this agent.  Patient still wants only where she was staying up prior to admission. Social worker is trying to contact [employer] in order to obtain records or find weather patient was placed or receiving act team.   Patient continues to walk around the unit with underwear  on her head   Per nursing: Patient is seen in the milieu. Continues to display psychotic behavior, obsessions and delusions. Walking back and forth in the hallways, talking to self, guarded. Safety precautions reinforced. Compliant with meds last night.      Principal Problem: Schizophrenia, unspecified type Diagnosis:   Patient Active Problem List   Diagnosis Date Noted  . Schizophrenia, unspecified type [F20.9] 09/24/2014  . High blood pressure [I10] 09/23/2014   Total Time spent with patient: 30 minutes   Past Medical History: History reviewed. No pertinent past medical history. History reviewed. No pertinent past surgical history. Family History: History reviewed. No pertinent family history. Social History:  History  Alcohol Use No     History  Drug Use No    History   Social History  . Marital Status: Married    Spouse Name: N/A  . Number of Children: N/A  . Years of Education: N/A   Social History Main Topics  . Smoking status: Never Smoker   . Smokeless tobacco: Not on file  . Alcohol Use: No  .  Drug Use: No  . Sexual Activity: Not on file   Other Topics Concern  . None   Social History Narrative   Additional History:    Sleep: Fair  Appetite:  Good   Assessment:   Musculoskeletal: Strength & Muscle Tone: within normal limits Gait & Station: normal Patient leans: N/A   Psychiatric Specialty Exam: Physical Exam   Review of Systems  Constitutional: Negative.   HENT: Positive for congestion.   Eyes: Negative.   Respiratory: Negative for cough.   Cardiovascular: Negative.   Gastrointestinal: Negative.   Genitourinary: Negative.   Musculoskeletal: Negative.   Skin: Negative.   Neurological: Negative.   Endo/Heme/Allergies: Negative.   Psychiatric/Behavioral: Positive for depression. Negative for suicidal ideas, hallucinations and substance abuse. The patient is not nervous/anxious and does not have insomnia.     Blood pressure 117/71, pulse 109, temperature 98.1 F (36.7 C), temperature source Oral, resp. rate 20, height 5\' 4"  (1.626 m), weight 79.379 kg (175 lb), SpO2 100 %.Body mass index is 30.02 kg/(m^2).  General Appearance: Fairly Groomed  Patent attorneyye Contact::  Good  Speech:  Clear and Coherent  Volume:  Normal  Mood:  Euthymic  Affect:  Congruent  Thought Process:  Irrelevant, Loose and Tangential  Orientation:  Full (Time, Place, and Person)  Thought Content:  Hallucinations: None  Suicidal Thoughts:  No  Homicidal Thoughts:  No  Memory:  Immediate;   Fair Recent;   Fair Remote;   Fair  Judgement:  Impaired  Insight:  Lacking  Psychomotor  Activity:  Normal  Concentration:  Fair  Recall:  NA  Fund of Knowledge:Fair  Language: Fair  Akathisia:  No  Handed:    AIMS (if indicated):     Assets:  Financial Resources/Insurance Vocational/Educational  ADL's:  Intact  Cognition: WNL  Sleep:  Number of Hours: 5.25     Current Medications: Current Facility-Administered Medications  Medication Dose Route Frequency Provider Last Rate Last Dose  .  acetaminophen (TYLENOL) tablet 650 mg  650 mg Oral Q6H PRN Audery Amel, MD      . alum & mag hydroxide-simeth (MAALOX/MYLANTA) 200-200-20 MG/5ML suspension 30 mL  30 mL Oral Q4H PRN Audery Amel, MD      . lisinopril (PRINIVIL,ZESTRIL) tablet 5 mg  5 mg Oral Daily Audery Amel, MD   5 mg at 09/25/14 1127  . loratadine (CLARITIN) tablet 10 mg  10 mg Oral Daily Jimmy Footman, MD   10 mg at 09/24/14 1206  . magnesium hydroxide (MILK OF MAGNESIA) suspension 30 mL  30 mL Oral Daily PRN Audery Amel, MD      . pseudoephedrine (SUDAFED) 12 hr tablet 120 mg  120 mg Oral BID Jimmy Footman, MD   120 mg at 09/25/14 1800  . risperiDONE (RISPERDAL M-TABS) disintegrating tablet 3 mg  3 mg Oral QHS Jimmy Footman, MD   3 mg at 09/25/14 2109    Lab Results:  No results found for this or any previous visit (from the past 48 hour(s)).  Physical Findings: AIMS: Facial and Oral Movements Muscles of Facial Expression: None, normal Lips and Perioral Area: None, normal Jaw: None, normal Tongue: None, normal,Extremity Movements Upper (arms, wrists, hands, fingers): None, normal Lower (legs, knees, ankles, toes): None, normal, Trunk Movements Neck, shoulders, hips: None, normal, Overall Severity Severity of abnormal movements (highest score from questions above): None, normal Incapacitation due to abnormal movements: None, normal Patient's awareness of abnormal movements (rate only patient's report): No Awareness, Dental Status Current problems with teeth and/or dentures?: No Does patient usually wear dentures?: No  CIWA:  CIWA-Ar Total: 0 COWS:     Treatment Plan Summary:  44 year old with possible diagnosis of schizophrenia. Presented to our emergency department under petition due to bizarre behavior and paranoia. Patient is a poor historian at this time and there is no collateral information. Per records looks the patient was in our system backing 2002 for  psychosis in [facility] emergency department.  Psychosis: Continue Risperdal M-tab. Will increase to  4 mg po qhs  Hypertension continue lisinopril 5 mg by mouth daily.  Labs: TSH wnl, lipid panel wnl.  HbA1c is pending.  Refused some of the labs ordered.  Collateral information: son confirmed diagnosis of schizophrenia. They are not very close.  He says she was living in a facility in [place].  Precautions continue every 15 minute checks  Hospitalization status continue involuntary commitment  Discharge planning: Per family pt was living in a [facility] or ALF prior to admission.   Medical Decision Making:  Established Problem, Stable/Improving (1)     Jimmy Footman 09/26/2014, 9:25 AM

## 2014-09-26 NOTE — Tx Team (Signed)
Interdisciplinary Treatment Plan Update (Adult)  Date:  09/26/2014 Time Reviewed:  5:01 PM  Progress in Treatment: Attending groups: Yes. Participating in groups:  No. Taking medication as prescribed:  Yes. Tolerating medication:  Yes. Family/Significant othe contact made:  No, will contact:  Pt refuses to provide information Patient understands diagnosis:  No. Discussing patient identified problems/goals with staff:  Yes. Medical problems stabilized or resolved:  Yes. Denies suicidal/homicidal ideation: Yes. Issues/concerns per patient self-inventory:  No. Other:  New problem(s) identified: NA  Discharge Plan or Barriers: CSW assessing.   Reason for Continuation of Hospitalization: Delusions  Medication stabilization Other; describe Paranoia   Comments: Gloria Frank is a 44 y.o. female with no known medical history who presented in the custody of the police with an IVC in place (7/09). They IVC'd her for bizarre and paranoid statements and behavior.She states that people have been following her, that she is a [place] pig for the government, her grandmother is missing, she does not participate in recycling programs. When ER doctor attempted to listen to her lungs, she pointed out that her lungs are here, pointing at her right shoulder. Per the ER psychiatrist the patient was not even able to give a reliable description of the events leading to hospitalization. She told a rambling story about how [place] living in [place]. Shecouldn't describe why police were approaching her or what transpired when they approached her. Every time she started to describe the situation and she derailed into bizarre nonsensical statements. The patient denied abusing any alcohol or drugs. She claimed she is not taking any prescription medicine. Per records patient [facility] back in 2012 due to psychosis. Labs were available,  she was not positive for any illicit substances. Also her basic metabolic panel and CBC were within the normal limits. She reported being treated with risperidone. Today patient was interviewed. She is a very limited historian. There is frequent derailment in her thought process. She does not answer questions about him is feeling even with significant redirection. Patient gave me the telephone number for her son and gave me permission to contact him [phone] his name is Gloria Frank. Substance abuse history: She denies that she drinks or uses any street drugs and also denies smoking. Says that she's never had a drug or alcohol problem in the past.   Estimated length of stay: 5-7 days  New goal(s): NA  Review of initial/current patient goals per problem list:   Attendees: Patient:  Gloria Frank 7/14/20165:01 PM  Family:   7/14/20165:01 PM  Physician:   7/14/20165:01 PM  Nursing:   Marylu LundJanet, RN 7/14/20165:01 PM  Clinical Social Worker:  [address]Mika Griffitts, LCSWA, [address]Jason Ingle Thurnell GarbeLCSWA, Tara Mitchell, [place]LCSW 7/14/20165:01 PM  Counselor:   7/14/20165:01 PM  Other:  Nolon LennertBeth Green 7/14/20165:01 PM  Other:  Garret ReddishHarvey, [facility] Liaison  7/14/20165:01 PM  Other:   7/14/20165:01 PM  Other:  7/14/20165:01 PM  Other:  7/14/20165:01 PM  Other:  7/14/20165:01 PM  Other:  7/14/20165:01 PM  Other:  7/14/20165:01 PM  Other:  7/14/20165:01 PM  Other:   7/14/20165:01 PM   Scribe for Treatment Team:   Rondall Allegraandace L Kreed Kauffman, MSW, LCSWA  09/26/2014, 5:01 PM

## 2014-09-27 NOTE — Progress Notes (Signed)
D: Pt denies SI/HI/AVH. Patient is pleasant, but intrusive and suspicious of staff. Affect is flat and sad, thoughts are disorganized, speech is tangential, non logical at times,    A: Pt was offered support and encouragement. Pt was given scheduled medications. Pt was encouraged to attend groups. Q 15 minute checks were done for safety.  R:Pt did not attend group, not interacting appropriatelyl with peers and staff. Pt is taking medication. Pt receptive to treatment and safety maintained on unit.

## 2014-09-27 NOTE — Progress Notes (Signed)
[facility] MD Progress Note  09/27/2014 9:02 AM Gloria Frank  MRN:  [id number] Subjective:  Patient was interviewed today. She continues to be very disorganized and constantly is making nonsensical statements. Her questions are bizarre and is very difficult for me to follow what she is trying to say. Patient is evasive when questioned about psychotic symptoms.  She denies ever being diagnosed with schizophrenia, continues to as stated that she has only been diagnosed with depression. She denies ever being on Risperdal even though it is documented in the chart the back in 2012 she was taking this agent.  Compliant with meds.  Patient continues to walk around the unit with underwear  on her head   Per nursing: D: Pt denies SI/HI/AVH. Patient is pleasant, but intrusive and suspicious of staff. Affect is flat and sad, thoughts are disorganized, speech is tangential, non logical at times,  A: Pt was offered support and encouragement. Pt was given scheduled medications. Pt was encouraged to attend groups. Q 15 minute checks were done for safety.  R:Pt did not attend group, not interacting appropriatelyl with peers and staff. Pt is taking medication. Pt receptive to treatment and safety maintained on unit.     Principal Problem: Schizophrenia, unspecified type Diagnosis:   Patient Active Problem List   Diagnosis Date Noted  . Schizophrenia, unspecified type [F20.9] 09/24/2014  . High blood pressure [I10] 09/23/2014   Total Time spent with patient: 30 minutes   Past Medical History: History reviewed. No pertinent past medical history. History reviewed. No pertinent past surgical history. Family History: History reviewed. No pertinent family history. Social History:  History  Alcohol Use No     History  Drug Use No    History   Social History  . Marital Status: Married    Spouse Name: N/A  . Number of Children: N/A  . Years of Education: N/A   Social History Main Topics  .  Smoking status: Never Smoker   . Smokeless tobacco: Not on file  . Alcohol Use: No  . Drug Use: No  . Sexual Activity: Not on file   Other Topics Concern  . None   Social History Narrative   Additional History:    Sleep: Fair  Appetite:  Good   Assessment:   Musculoskeletal: Strength & Muscle Tone: within normal limits Gait & Station: normal Patient leans: N/A   Psychiatric Specialty Exam: Physical Exam   Review of Systems  Constitutional: Negative.   HENT: Positive for congestion.   Eyes: Negative.   Respiratory: Negative for cough.   Cardiovascular: Negative.   Gastrointestinal: Negative.   Genitourinary: Negative.   Musculoskeletal: Negative.   Skin: Negative.   Neurological: Negative.   Endo/Heme/Allergies: Negative.   Psychiatric/Behavioral: Positive for depression. Negative for suicidal ideas, hallucinations and substance abuse. The patient is not nervous/anxious and does not have insomnia.     Blood pressure 117/71, pulse 109, temperature 98.1 F (36.7 C), temperature source Oral, resp. rate 20, height  (1.626 m), weight 79.379 kg (175 lb), SpO2 100 %.Body mass index is 30.02 kg/(m^2).  General Appearance: Fairly Groomed  Patent attorney::  Good  Speech:  Clear and Coherent  Volume:  Normal  Mood:  Euthymic  Affect:  Congruent  Thought Process:  Irrelevant, Loose and Tangential  Orientation:  Full (Time, Place, and Person)  Thought Content:  Hallucinations: None  Suicidal Thoughts:  No  Homicidal Thoughts:  No  Memory:  Immediate;   Fair Recent;   Fair  Remote;   Fair  Judgement:  Impaired  Insight:  Lacking  Psychomotor Activity:  Normal  Concentration:  Fair  Recall:  NA  Fund of Knowledge:Fair  Language: Fair  Akathisia:  No  Handed:    AIMS (if indicated):     Assets:  Financial Resources/Insurance Vocational/Educational  ADL's:  Intact  Cognition: WNL  Sleep:  Number of Hours: 5.75     Current Medications: Current  Facility-Administered Medications  Medication Dose Route Frequency Provider Last Rate Last Dose  . acetaminophen (TYLENOL) tablet 650 mg  650 mg Oral Q6H PRN Audery AmelJohn T Clapacs, MD      . alum & mag hydroxide-simeth (MAALOX/MYLANTA) 200-200-20 MG/5ML suspension 30 mL  30 mL Oral Q4H PRN Audery AmelJohn T Clapacs, MD      . lisinopril (PRINIVIL,ZESTRIL) tablet 5 mg  5 mg Oral Daily Audery AmelJohn T Clapacs, MD   5 mg at 09/25/14 1127  . loratadine (CLARITIN) tablet 10 mg  10 mg Oral Daily Jimmy FootmanAndrea Hernandez-Gonzalez, MD   10 mg at 09/24/14 1206  . magnesium hydroxide (MILK OF MAGNESIA) suspension 30 mL  30 mL Oral Daily PRN Audery AmelJohn T Clapacs, MD      . risperiDONE (RISPERDAL M-TABS) disintegrating tablet 4 mg  4 mg Oral QHS Jimmy FootmanAndrea Hernandez-Gonzalez, MD   4 mg at 09/26/14 2157    Lab Results:  No results found for this or any previous visit (from the past 48 hour(s)).  Physical Findings: AIMS: Facial and Oral Movements Muscles of Facial Expression: None, normal Lips and Perioral Area: None, normal Jaw: None, normal Tongue: None, normal,Extremity Movements Upper (arms, wrists, hands, fingers): None, normal Lower (legs, knees, ankles, toes): None, normal, Trunk Movements Neck, shoulders, hips: None, normal, Overall Severity Severity of abnormal movements (highest score from questions above): None, normal Incapacitation due to abnormal movements: None, normal Patient's awareness of abnormal movements (rate only patient's report): No Awareness, Dental Status Current problems with teeth and/or dentures?: No Does patient usually wear dentures?: No  CIWA:  CIWA-Ar Total: 0 COWS:     Treatment Plan Summary:  44 year old with possible diagnosis of schizophrenia. Presented to our emergency department under petition due to bizarre behavior and paranoia. Patient is a poor historian at this time and there is no collateral information. Per records looks the patient was in our system backing 2002 [facility].  Schizophrenia: Continue Risperdal M-tab. 4 mg po qhs.  Minimal improvement since admission.  Continues to be bizarre and disorganized.  Hypertension continue lisinopril 5 mg by mouth daily.  Consider b blocker instead as HR has been >100  Labs: TSH wnl, lipid panel wnl.  HbA1c is pending.  Refused some of the labs ordered.  Collateral information: son confirmed diagnosis of schizophrenia. They are not very close.  He says she was living in a facility in [place].  Precautions continue every 15 minute checks  Hospitalization status continue involuntary commitment  Discharge planning: [facility]   Medical Decision Making:  Established Problem, Stable/Improving (1)     Jimmy FootmanHernandez-Gonzalez,  Kyanne Rials 09/27/2014, 9:02 AM

## 2014-09-27 NOTE — Plan of Care (Signed)
Problem: Ineffective individual coping Goal: STG: Patient will remain free from self harm Outcome: Progressing Patient denies SI/HI, 15 minutes checks maintained.      

## 2014-09-27 NOTE — [facility] Group Notes (Signed)
[facility]  09/27/2014 3:52 PM  Participation Quality:  did not attend group   Lulu RidingIngle, Amel Gianino T, MSW, LCSWA 09/27/2014, 3:52 PM

## 2014-09-27 NOTE — Progress Notes (Signed)
Patient alert today. She has a blunted affect and has been labile today with delusional thinking. Patient has had multiple requests and strange demands today.  She refused her meds this am. Denies suicidal thoughts.   Became angry when asked if she was hearing any voices but did appear to be laughing to herself while reading a newspaper.   Will cont to monitor for safety.

## 2014-09-27 NOTE — [facility] Group Notes (Signed)
[facility] Group Notes:  (Nursing/MHT/Case Management/Adjunct)  Date:  09/27/2014  Time:  11:57 AM  Type of Therapy:  Psychoeducational Skills  Participation Level:  Did Not Attend   Gloria Frank Lea Campbell 09/27/2014, 11:57 AM 

## 2014-09-28 NOTE — Progress Notes (Signed)
D: Pt denies SI/HI, appears to be responding to internal stimuli, affect is blunted and sad. Patient is intrusive and suspicious of staff, her thoughts are disorganized and illogical with flight of ideas. Patient is very needy with bizarre  request at different times.  A: Pt was offered support and encouragement. Pt was given scheduled medications. Pt was encouraged to attend groups. Q 15 minute checks were done for safety.  R: Pt did not attends group and is not interacting appropriately with peers and staff. Pt compliant with medication.Patient has multiple complaints. Safety maintained on unit.

## 2014-09-28 NOTE — [facility] Group Notes (Signed)
[facility] Group Notes:  (Nursing/MHT/Case Management/Adjunct)  Date:  09/28/2014  Time:  10:09 AM  Type of Therapy:  Group Therapy  Participation Level:  Did Not Attend  Summary of Progress/Problems:  Gloria Frank Gloria Frank 09/28/2014, 10:09 AM

## 2014-09-28 NOTE — Plan of Care (Signed)
Problem: Alteration in thought process Goal: LTG-Patient behavior demonstrates decreased signs psychosis (Patient behavior demonstrates decreased signs of psychosis to the point the patient is safe to return home and continue treatment in an outpatient setting.) Outcome: Not Progressing Patient still displays disorganized thoughts, illogical conversation with flight of ideas.  Goal: LTG-Patient verbalizes understanding importance med regimen (Patient verbalizes understanding of importance of medication regimen and need to continue outpatient care.) Outcome: Progressing Patient is complaint with medication regimen.

## 2014-09-28 NOTE — Progress Notes (Addendum)
[facility]  09/28/2014 11:36 AM Lorelee MarketChiquita D Gaines-Jones  MRN:  [id number] Subjective:  Patient is a 44 year old female with history of schizophrenia who was seen for follow-up. She continues to remain bizarre and disorganized and reported to me that she has been walking 15 minutes per day to help her. She continues to have paranoia and has delusional thoughts. She was asking me about my country of her origin  and then has to be the directed. She was talking about her medications but has no idea about what medications she has been taking. She has to be redirected multiple times during the interview. She appeared calm and does not have any agitation or behavioral issues at this time.  Staff is monitoring her closely.    Principal Problem: Schizophrenia, unspecified type Diagnosis:   Patient Active Problem List   Diagnosis Date Noted  . Schizophrenia, unspecified type [F20.9] 09/24/2014  . High blood pressure [I10] 09/23/2014   Total Time spent with patient: 30 minutes   Past Medical History: History reviewed. No pertinent past medical history. History reviewed. No pertinent past surgical history. Family History: History reviewed. No pertinent family history. Social History:  History  Alcohol Use No     History  Drug Use No    History   Social History  . Marital Status: Married    Spouse Name: N/A  . Number of Children: N/A  . Years of Education: N/A   Social History Main Topics  . Smoking status: Never Smoker   . Smokeless tobacco: Not on file  . Alcohol Use: No  . Drug Use: No  . Sexual Activity: Not on file   Other Topics Concern  . None   Social History Narrative   Additional History:    Sleep: Fair  Appetite:  Good   Assessment:   Musculoskeletal: Strength & Muscle Tone: within normal limits Gait & Station: normal Patient leans: N/A   Psychiatric Specialty Exam: Physical Exam   Review of Systems  Constitutional: Negative.   HENT:  Positive for congestion.   Eyes: Negative.   Respiratory: Negative for cough.   Cardiovascular: Negative.   Gastrointestinal: Negative.   Genitourinary: Negative.   Musculoskeletal: Negative.   Skin: Negative.   Neurological: Negative.   Endo/Heme/Allergies: Negative.   Psychiatric/Behavioral: Positive for depression. Negative for suicidal ideas, hallucinations and substance abuse. The patient is not nervous/anxious and does not have insomnia.     Blood pressure 117/81, pulse 77, temperature 98.1 F (36.7 C), temperature source Oral, resp. rate 20, height 5\' 4"  (1.626 m), weight 175 lb (79.379 kg), SpO2 100 %.Body mass index is 30.02 kg/(m^2).  General Appearance: Fairly Groomed  Patent attorneyye Contact::  Good  Speech:  Clear and Coherent  Volume:  Normal  Mood:  Euthymic  Affect:  Congruent  Thought Process:  Irrelevant, Loose and Tangential  Orientation:  Full (Time, Place, and Person)  Thought Content:  Hallucinations: None  Suicidal Thoughts:  No  Homicidal Thoughts:  No  Memory:  Immediate;   Fair Recent;   Fair Remote;   Fair  Judgement:  Impaired  Insight:  Lacking  Psychomotor Activity:  Normal  Concentration:  Fair  Recall:  NA  Fund of Knowledge:Fair  Language: Fair  Akathisia:  No  Handed:    AIMS (if indicated):     Assets:  Financial Resources/Insurance Vocational/Educational  ADL's:  Intact  Cognition: WNL  Sleep:  Number of Hours: 3     Current Medications: Current Facility-Administered Medications  Medication Dose Route Frequency Provider Last Rate Last Dose  . acetaminophen (TYLENOL) tablet 650 mg  650 mg Oral Q6H PRN Audery Amel, MD      . alum & mag hydroxide-simeth (MAALOX/MYLANTA) 200-200-20 MG/5ML suspension 30 mL  30 mL Oral Q4H PRN Audery Amel, MD      . lisinopril (PRINIVIL,ZESTRIL) tablet 5 mg  5 mg Oral Daily Audery Amel, MD   5 mg at 09/28/14 0957  . loratadine (CLARITIN) tablet 10 mg  10 mg Oral Daily Jimmy Footman, MD   10 mg  at 09/28/14 0957  . magnesium hydroxide (MILK OF MAGNESIA) suspension 30 mL  30 mL Oral Daily PRN Audery Amel, MD      . risperiDONE (RISPERDAL M-TABS) disintegrating tablet 4 mg  4 mg Oral QHS Jimmy Footman, MD   4 mg at 09/27/14 2215    Lab Results:  No results found for this or any previous visit (from the past 48 hour(s)).  Physical Findings: AIMS: Facial and Oral Movements Muscles of Facial Expression: None, normal Lips and Perioral Area: None, normal Jaw: None, normal Tongue: None, normal,Extremity Movements Upper (arms, wrists, hands, fingers): None, normal Lower (legs, knees, ankles, toes): None, normal, Trunk Movements Neck, shoulders, hips: None, normal, Overall Severity Severity of abnormal movements (highest score from questions above): None, normal Incapacitation due to abnormal movements: None, normal Patient's awareness of abnormal movements (rate only patient's report): No Awareness, Dental Status Current problems with teeth and/or dentures?: No Does patient usually wear dentures?: No  CIWA:  CIWA-Ar Total: 0 COWS:     Treatment Plan Summary:  44 year old with possible diagnosis of schizophrenia. Presented to our emergency department under petition due to bizarre behavior and paranoia. Patient is a poor historian at this time and there is no collateral information. Per records looks the patient was in our system backing 2002 for psychosis in [facility] emergency department.  Schizophrenia: Continue Risperdal M-tab. 4 mg po qhs.  Minimal improvement since admission.  Continues to be bizarre and disorganized.  Hypertension continue lisinopril 5 mg by mouth daily.  Consider b blocker instead as HR has been >100  Labs: TSH wnl, lipid panel wnl.  HbA1c is pending.  Refused some of the labs ordered.  Collateral information: son confirmed diagnosis of schizophrenia. They are not very close.  He says she was living in a facility in [place].  Precautions  continue every 15 minute checks  Hospitalization status continue involuntary commitment  Discharge planning: Per family pt was living in a [facility] or ALF prior to admission.   Medical Decision Making:  Established Problem, Stable/Improving (1)

## 2014-09-28 NOTE — [facility] Group Notes (Signed)
[facility] LCSW Group Therapy  09/28/2014 3:27 PM  Type of Therapy:  Group Therapy  Participation Level:  Did Not Attend  Modes of Intervention:  Discussion, Education, Socialization and Support  Summary of Progress/Problems: Patients identify obstacles, self-sabotaging and enabling behaviors. Patients explore aspects of self sabotage and enabling and how to limit these self-destructive behaviors in everyday life. Pt attended group briefly. Upon arrival she asked "Is this group assigned to me?" When told group are not assigned, they are open to everyone. While exiting she stated "I am going to ask them to assign me to a group for depression."   Sempra EnergyCandace L Rileigh Kawashima MSW, LCSWA  09/28/2014, 3:27 PM

## 2014-09-28 NOTE — [facility] Group Notes (Signed)
[facility] Group Notes:  (Nursing/MHT/Case Management/Adjunct)  Date:  09/28/2014  Time:  2:52 AM  Type of Therapy:  Group Therapy  Participation Level:  None   Summary of Progress/Problems: Left Early   Gloria Frank 09/28/2014, 2:52 AM

## 2014-09-28 NOTE — Progress Notes (Addendum)
Patient continues to walk around the unit with underwear on her head   Pt denies SI/HI/AVH. Patient is pleasant and some what coopperative, but intrusive and suspicious of staff at times. Affect is flat and sad, thoughts are disorganized, speech is tangential and non logical at times. Pt was offered support and encouragement. Pt was given scheduled medications. Pt was encouraged to attend groups. Q 15 minute checks were done for safety.  Pt did not attend any unit activities Pt does not  interacting appropriately with peers or staff. Pt is taking medication. Will continue to observe,redirect as needed and maintain a safe environment.

## 2014-09-28 NOTE — [facility] Group Notes (Signed)
[facility] LCSW Group Therapy  09/28/2014 9:21 AM  Type of Therapy:  Group Therapy  Participation Level:  Did Not Attend  Modes of Intervention:  Discussion, Education, Socialization and Support  Summary of Progress/Problems:Feelings around Relapse. Group members discussed the meaning of relapse and shared personal stories of relapse, how it affected them and others, and how they perceived themselves during this time. Group members were encouraged to identify triggers, warning signs and coping skills used when facing the possibility of relapse. Social supports were discussed and explored in detail.   Bryker Fletchall L Brandonlee Navis MSW, LCSWA  09/28/2014, 9:21 AM  

## 2014-09-29 MED ORDER — RISPERIDONE 3 MG PO TABS
3.0000 mg | ORAL_TABLET | Freq: Every day | ORAL | Status: DC
  Administered 2014-09-29: 3 mg via ORAL
  Filled 2014-09-29: qty 1

## 2014-09-29 NOTE — Progress Notes (Signed)
[facility] MD Progress Note  09/29/2014 11:30 AM Gloria Frank  MRN:  [id number] Subjective:  Patient is a 44 year old female with history of schizophrenia who was seen for follow-up. She continues to remain bizarre and disorganized and reported to me that she has been having adverse effects from her psycho topic medications. She reported that she feels that the Risperdal is too strong and she is unable to wake up in the morning. She stated that she wants to have contact with her son on a daily basis and was focused on the staff that they should help her call him at 6 PM every evening. Patient remains tangential and was difficult to redirect. She has to be redirected multiple times during the interview. Staff is monitoring her closely.    Principal Problem: Schizophrenia, unspecified type Diagnosis:   Patient Active Problem List   Diagnosis Date Noted  . Schizophrenia, unspecified type [F20.9] 09/24/2014  . High blood pressure [I10] 09/23/2014   Total Time spent with patient: 30 minutes   Past Medical History: History reviewed. No pertinent past medical history. History reviewed. No pertinent past surgical history. Family History: History reviewed. No pertinent family history. Social History:  History  Alcohol Use No     History  Drug Use No    History   Social History  . Marital Status: Married    Spouse Name: N/A  . Number of Children: N/A  . Years of Education: N/A   Social History Main Topics  . Smoking status: Never Smoker   . Smokeless tobacco: Not on file  . Alcohol Use: No  . Drug Use: No  . Sexual Activity: Not on file   Other Topics Concern  . None   Social History Narrative   Additional History:    Sleep: Fair  Appetite:  Good   Assessment:   Musculoskeletal: Strength & Muscle Tone: within normal limits Gait & Station: normal Patient leans: N/A   Psychiatric Specialty Exam: Physical Exam   Review of Systems  Constitutional: Negative.    HENT: Positive for congestion.   Eyes: Negative.   Respiratory: Negative for cough.   Cardiovascular: Negative.   Gastrointestinal: Negative.   Genitourinary: Negative.   Musculoskeletal: Negative.   Skin: Negative.   Neurological: Negative.   Endo/Heme/Allergies: Negative.   Psychiatric/Behavioral: Positive for depression. Negative for suicidal ideas, hallucinations and substance abuse. The patient is not nervous/anxious and does not have insomnia.     Blood pressure 117/81, pulse 77, temperature 98.1 F (36.7 C), temperature source Oral, resp. rate 20, height  (1.626 m), weight 175 lb (79.379 kg), SpO2 100 %.Body mass index is 30.02 kg/(m^2).  General Appearance: Fairly Groomed  Patent attorney::  Good  Speech:  Clear and Coherent  Volume:  Normal  Mood:  Euthymic  Affect:  Congruent  Thought Process:  Irrelevant, Loose and Tangential  Orientation:  Full (Time, Place, and Person)  Thought Content:  Hallucinations: None  Suicidal Thoughts:  No  Homicidal Thoughts:  No  Memory:  Immediate;   Fair Recent;   Fair Remote;   Fair  Judgement:  Impaired  Insight:  Lacking  Psychomotor Activity:  Normal  Concentration:  Fair  Recall:  NA  Fund of Knowledge:Fair  Language: Fair  Akathisia:  No  Handed:    AIMS (if indicated):     Assets:  Financial Resources/Insurance Vocational/Educational  ADL's:  Intact  Cognition: WNL  Sleep:  Number of Hours: 4.75     Current Medications: Current Facility-Administered  Medications  Medication Dose Route Frequency Provider Last Rate Last Dose  . acetaminophen (TYLENOL) tablet 650 mg  650 mg Oral Q6H PRN Audery AmelJohn T Clapacs, MD      . alum & mag hydroxide-simeth (MAALOX/MYLANTA) 200-200-20 MG/5ML suspension 30 mL  30 mL Oral Q4H PRN Audery AmelJohn T Clapacs, MD      . lisinopril (PRINIVIL,ZESTRIL) tablet 5 mg  5 mg Oral Daily Audery AmelJohn T Clapacs, MD   5 mg at 09/28/14 0957  . loratadine (CLARITIN) tablet 10 mg  10 mg Oral Daily Jimmy FootmanAndrea Hernandez-Gonzalez,  MD   10 mg at 09/28/14 0957  . magnesium hydroxide (MILK OF MAGNESIA) suspension 30 mL  30 mL Oral Daily PRN Audery AmelJohn T Clapacs, MD      . risperiDONE (RISPERDAL) tablet 3 mg  3 mg Oral QHS Brandy HaleUzma Dominion Kathan, MD        Lab Results:  No results found for this or any previous visit (from the past 48 hour(s)).  Physical Findings: AIMS: Facial and Oral Movements Muscles of Facial Expression: None, normal Lips and Perioral Area: None, normal Jaw: None, normal Tongue: None, normal,Extremity Movements Upper (arms, wrists, hands, fingers): None, normal Lower (legs, knees, ankles, toes): None, normal, Trunk Movements Neck, shoulders, hips: None, normal, Overall Severity Severity of abnormal movements (highest score from questions above): None, normal Incapacitation due to abnormal movements: None, normal Patient's awareness of abnormal movements (rate only patient's report): No Awareness, Dental Status Current problems with teeth and/or dentures?: No Does patient usually wear dentures?: No  CIWA:  CIWA-Ar Total: 0 COWS:     Treatment Plan Summary:  44 year old with possible diagnosis of schizophrenia. Presented to our emergency department under petition due to bizarre behavior and paranoia. Patient is a poor historian at this time and there is no collateral information. Per records looks the patient was in our system backing 2002 [facility].  Schizophrenia: Continue Risperdal  3 mg po qhs.  I will decrease the dose to 3 mg to decrease the side effects of the medications. Minimal improvement since admission.  Continues to be bizarre and disorganized.  Hypertension continue lisinopril 5 mg by mouth daily.  Consider b blocker instead as HR has been >100  Labs: TSH wnl, lipid panel wnl.  HbA1c is pending.  Refused some of the labs ordered.  Collateral information: son confirmed diagnosis of schizophrenia. They are not very close.  He says she was living in a  facility in [place].  Precautions continue every 15 minute checks  Hospitalization status continue involuntary commitment  Discharge planning: [facility]   Medical Decision Making:  Established Problem, Stable/Improving (1)

## 2014-09-29 NOTE — [facility] Group Notes (Signed)
[facility] LCSW Group Therapy  09/29/2014 6:55 PM  Type of Therapy:  Group Therapy  Participation Level:  Did Not Attend  Modes of Intervention:  Discussion, Education, Role-play, Socialization and Support  Summary of Progress/Problems:Communications: Patients identify how individuals communicate with one another appropriately and inappropriately. Patients will be guided to discuss their thoughts, feelings, and behaviors related to barriers when communicating. The group will process together ways to execute positive and appropriate communications   Sempra EnergyCandace L Rashunda Frank MSW, LCSWA  09/29/2014, 6:55 PM

## 2014-09-29 NOTE — Plan of Care (Signed)
Problem: Ineffective individual coping Goal: LTG: Patient will report a decrease in negative feelings Outcome: Not Met (add Reason) Patient is unclear in her statements, disorganized. Difficult to determine consistent mood.  Goal: STG: Patient will remain free from self harm Outcome: Progressing No self-harm.  Goal: STG:Pt. will utilize relaxation techniques to reduce stress STG: Patient will utilize relaxation techniques to reduce stress levels  Outcome: Progressing Patient is less intrusive / outwardly stressed than last night. Utilized day room and independent activities in her room.

## 2014-09-29 NOTE — [facility] Group Notes (Signed)
[facility] Group Notes:  (Nursing/MHT/Case Management/Adjunct)  Date:  09/29/2014  Time:  9:04 AM  Type of Therapy:  goal setting   Participation Level:  Did Not Attend   Gloria Frank 09/29/2014, 9:04 AM 

## 2014-09-29 NOTE — [facility] Group Notes (Signed)
[facility] Group Notes:  (Nursing/MHT/Case Management/Adjunct)  Date:  09/29/2014  Time:  10:44 PM  Type of Therapy:  Group Therapy  Participation Level:  Minimal  Participation Quality:  Appropriate and Inattentive  Affect:  Appropriate  Cognitive:  Appropriate  Insight:  Appropriate  Engagement in Group:  Engaged  Modes of Intervention:  Discussion  Summary of Progress/Problems:  Gloria Frank 09/29/2014, 10:44 PM

## 2014-09-30 MED ORDER — RISPERIDONE 3 MG PO TABS
3.0000 mg | ORAL_TABLET | Freq: Two times a day (BID) | ORAL | Status: DC
  Administered 2014-09-30 – 2014-10-01 (×2): 3 mg via ORAL
  Filled 2014-09-30 (×6): qty 1

## 2014-09-30 NOTE — Progress Notes (Signed)
[facility] MD Progress Note  09/30/2014 11:20 AM KEYARRA RENDALL  MRN:  [id number] Subjective:  Pt seen in her room today.Patient is a 44 year old female with history of schizophrenia who was seen for follow-up. She continues to remain bizarre and disorganized and reported to me that she has been having adverse effects from her psycho topic medications. She reported that she feels that the Risperdal is too strong and she is unable to wake up in the morning. She stated that she wants to have contact with her son on a daily basis and was focused on the staff that they should help her call him at 6 PM every evening. Patient remains tangential and was difficult to redirect. She has to be redirected multiple times during the interview. Staff is monitoring her closely. Staff reports that she likes and feels that Zyprexa Zydis is better instead of a pill.    Principal Problem: Schizophrenia, unspecified type Diagnosis:   Patient Active Problem List   Diagnosis Date Noted  . Schizophrenia, unspecified type [F20.9] 09/24/2014  . High blood pressure [I10] 09/23/2014   Total Time spent with patient: 30 minutes   Past Medical History: History reviewed. No pertinent past medical history. History reviewed. No pertinent past surgical history. Family History: History reviewed. No pertinent family history. Social History:  History  Alcohol Use No     History  Drug Use No    History   Social History  . Marital Status: Married    Spouse Name: N/A  . Number of Children: N/A  . Years of Education: N/A   Social History Main Topics  . Smoking status: Never Smoker   . Smokeless tobacco: Not on file  . Alcohol Use: No  . Drug Use: No  . Sexual Activity: Not on file   Other Topics Concern  . None   Social History Narrative   Additional History:    Sleep: Fair  Appetite:  Good   Assessment:   Musculoskeletal: Strength & Muscle Tone: within normal limits Gait & Station: normal Patient  leans: N/A   Psychiatric Specialty Exam: Physical Exam  Review of Systems  Constitutional: Negative.   HENT: Negative for congestion.   Eyes: Negative.   Respiratory: Negative for cough.   Cardiovascular: Negative.   Gastrointestinal: Negative.   Genitourinary: Negative.   Musculoskeletal: Negative.   Skin: Negative.   Neurological: Negative.   Endo/Heme/Allergies: Negative.   Psychiatric/Behavioral: Positive for depression. Negative for suicidal ideas, hallucinations and substance abuse. The patient is not nervous/anxious and does not have insomnia.     Blood pressure 117/81, pulse 77, temperature 98.1 F (36.7 C), temperature source Oral, resp. rate 20, height  (1.626 m), weight 79.379 kg (175 lb), SpO2 100 %.Body mass index is 30.02 kg/(m^2).  General Appearance: Fairly Groomed  Patent attorney::  Good  Speech:  Clear and Coherent  Volume:  Normal  Mood:  Euthymic  Affect:  Congruent  Thought Process:  Irrelevant, Loose and Tangential and disorganized and is hard to re-direct.  Orientation:  Full (Time, Place, and Person)  Thought Content:  Hallucinations: None  Suicidal Thoughts:  No  Homicidal Thoughts:  No  Memory:  Immediate;   Fair Recent;   Fair Remote;   Fair  Judgement:  Impaired  Insight:  Lacking  Psychomotor Activity:  Normal  Concentration:  Fair  Recall:  NA  Fund of Knowledge:Fair  Language: Fair  Akathisia:  No  Handed:    AIMS (if indicated):  Assets:  Financial Resources/Insurance Vocational/Educational  ADL's:  Intact  Cognition: WNL  Sleep:  Number of Hours: 3.75     Current Medications: Current Facility-Administered Medications  Medication Dose Route Frequency Provider Last Rate Last Dose  . acetaminophen (TYLENOL) tablet 650 mg  650 mg Oral Q6H PRN Audery AmelJohn T Clapacs, MD   650 mg at 09/29/14 2150  . alum & mag hydroxide-simeth (MAALOX/MYLANTA) 200-200-20 MG/5ML suspension 30 mL  30 mL Oral Q4H PRN Audery AmelJohn T Clapacs, MD      . lisinopril  (PRINIVIL,ZESTRIL) tablet 5 mg  5 mg Oral Daily Audery AmelJohn T Clapacs, MD   5 mg at 09/30/14 1008  . loratadine (CLARITIN) tablet 10 mg  10 mg Oral Daily Jimmy FootmanAndrea Hernandez-Gonzalez, MD   10 mg at 09/30/14 1009  . magnesium hydroxide (MILK OF MAGNESIA) suspension 30 mL  30 mL Oral Daily PRN Audery AmelJohn T Clapacs, MD      . risperiDONE (RISPERDAL) tablet 3 mg  3 mg Oral QHS Brandy HaleUzma Faheem, MD   3 mg at 09/29/14 2148    Lab Results:  No results found for this or any previous visit (from the past 48 hour(s)).  Physical Findings: AIMS: Facial and Oral Movements Muscles of Facial Expression: None, normal Lips and Perioral Area: None, normal Jaw: None, normal Tongue: None, normal,Extremity Movements Upper (arms, wrists, hands, fingers): None, normal Lower (legs, knees, ankles, toes): None, normal, Trunk Movements Neck, shoulders, hips: None, normal, Overall Severity Severity of abnormal movements (highest score from questions above): None, normal Incapacitation due to abnormal movements: None, normal Patient's awareness of abnormal movements (rate only patient's report): No Awareness, Dental Status Current problems with teeth and/or dentures?: No Does patient usually wear dentures?: No  CIWA:  CIWA-Ar Total: 0 COWS:     Treatment Plan Summary:  44 year old with possible diagnosis of schizophrenia. Presented to our emergency department under petition due to bizarre behavior and paranoia. Patient is a poor historian at this time and there is no collateral information. Per records looks the patient was in our system backing 2002 [facility].  Schizophrenia: Continue Risperdal  And increase dose to 3 mgs po bid..  I will decrease the dose to 3 mg to decrease the side effects of the medications. Minimal improvement since admission.  Continues to be bizarre and disorganized.  Hypertension continue lisinopril 5 mg by mouth daily.  Consider b blocker instead as HR has been  >100  Labs: TSH wnl, lipid panel wnl.  HbA1c is pending.  Refused some of the labs ordered.  Collateral information: son confirmed diagnosis of schizophrenia. They are not very close.  He says she was living in a facility in [place].  Precautions continue every 15 minute checks  Hospitalization status continue involuntary commitment  Discharge planning: Per family [facility].   Medical Decision Making:  Established Problem, Stable/Improving (1)

## 2014-09-30 NOTE — Progress Notes (Signed)
D: Patient denies SI/HI/AVH. Patient is intrusive and suspicious. Patient did attend evening group. Patient visible on the milieu. No distress noted. A: Support and encouragement offered. Scheduled medications given to pt. Q 15 min checks continued for patient safety. R: Patient receptive. Patient remains safe on the unit.          

## 2014-09-30 NOTE — Progress Notes (Signed)
D: Patient denies SI/HI/AVH. Patient is intrusive and suspicious. Patient did attend evening group. Patient visible on the milieu. No distress noted. A: Support and encouragement offered. Scheduled medications given to pt. Q 15 min checks continued for patient safety. R: Patient receptive. Patient remains safe on the unit.

## 2014-09-30 NOTE — [facility] Group Notes (Signed)
[facility] LCSW Group Therapy  09/30/2014 2:15 PM  Type of Therapy:  Group Therapy  Participation Level:  Did Not Attend  Participation Quality:    Affect:    Cognitive:    Insight:    Engagement in Therapy:    Modes of Intervention:    Summary of Progress/Problems:  Cheron SchaumannBandi, Sandy Blouch M 09/30/2014, 2:15 PM

## 2014-09-30 NOTE — [facility] Group Notes (Signed)
[facility] Group Note  09/30/2014 10:14 AM  Participation Quality:  DID NOT ATTEND  Affect:    Cognitive:    Insight:    Engagement in Group:    Modes of Intervention:    Summary of Progress/Problems:  Gloria SchaumannBandi, Gloria Frank 09/30/2014, 10:14 AM

## 2014-09-30 NOTE — [facility] Group Notes (Signed)
[facility] Group Notes:  (Nursing/MHT/Case Management/Adjunct)  Date:  09/30/2014  Time:  2:07 PM  Type of Therapy:  Psychoeducational Skills  Participation Level:  Did Not Attend  [name] 09/30/2014, 2:07 PM

## 2014-10-01 NOTE — Progress Notes (Signed)
LCSW [facility]- Need IVC outcome documents and LCSW had to request new lab work CBC admissions screening that was more recent. LCSW will [facility] extra [facility] once they are completed and reviewed. Still awaiting  From  [facility] with an  authorization- Printed off all doctor notes from July 13th-19th and will package all required documents and [facility]

## 2014-10-01 NOTE — Progress Notes (Signed)
[facility]CB [facility] and supporting documents for [facility]. LCSW SL will call to follow up with [facility]. Updated [facility]

## 2014-10-01 NOTE — Progress Notes (Signed)
Patient has had a labile mood today but has been easily redirected. Speech with loose association. Affect flat. Denies SI/HI/AVH. Continue to monitor.

## 2014-10-01 NOTE — Progress Notes (Signed)
D: Patient denies SI/HI/AVH.  Patient is intrusive and suspicious.  Patient became irritable with staff set limits to discourage her monopolization of staff's time.  Patient did attend evening group. Patient visible on the milieu. No distress noted. A: Support and encouragement offered. Scheduled medications given to pt. Q 15 min checks continued for patient safety. R: Patient not receptive to staff requests. Patient remains safe on the unit.

## 2014-10-01 NOTE — Tx Team (Signed)
Interdisciplinary Treatment Plan Update (Adult)  Date:  10/01/2014 Time Reviewed:  2:42 PM  Progress in Treatment: Attending groups: Yes. Participating in groups:  Comes in and leaves 5 minutes once group has started Taking medication as prescribed:  yes Tolerating medication:  Yes. Family/Significant othe contact made:  Yes, individual(s) contacted:   Son Jess BartersXavier was called Patient understands diagnosis:  No. Discussing patient identified problems/goals with staff:  No. Medical problems stabilized or resolved:  Yes. Denies suicidal/homicidal ideation: Yes. Issues/concerns per patient self-inventory:  Yes. Other:  New problem(s) identified: Patient has no insight to her illness  Discharge Plan or Barriers: [facility] referral made awaiting bed  Reason for Continuation of Hospitalization: Medication stabilization, awaiting a [facility] bed  Comments: Patient is not able to develop insight that she is unwell at this time, patient remains paranoid and several delusional thoughts  Estimated length of stay: 7 days  New goal(s):  Review of initial/current patient goals per problem list:   Refer to plan of care  Attendees: Patient:  Lorelee MarketChiquita D Gaines-Jones 7/19/20162:42 PM  Family:   7/19/20162:42 PM  Physician:  Dr Guss [place] 7/19/20162:42 PM  Nursing:   Carole CivilWendy Springfellow 7/19/20162:42 PM  Case Manager:   7/19/20162:42 PM  Counselor:   7/19/20162:42 PM  Other:  Jake SharkSara Laws LCSW 7/19/20162:42 PM  Other:  Arrie Senatelaudine Takerra Lupinacci LCSW 7/19/20162:42 PM  Other:   7/19/20162:42 PM  Other:  7/19/20162:42 PM  Other:  7/19/20162:42 PM  Other:  7/19/20162:42 PM  Other:  7/19/20162:42 PM  Other:  7/19/20162:42 PM  Other:  7/19/20162:42 PM  Other:   7/19/20162:42 PM   Scribe for Treatment Team:   Cheron SchaumannBandi, Wade Asebedo M, 10/01/2014, 2:42 PM

## 2014-10-01 NOTE — Progress Notes (Signed)
LCSW called [facility] and spoke to Connie-Demographics was [facility].   Faxed request for [employer] and awaiting a number.

## 2014-10-01 NOTE — [facility] Group Notes (Signed)
[facility] LCSW Group Therapy  10/01/2014 1:10 PM  Type of Therapy:  Group Therapy  Participation Level:  Did Not Attend  Participation Quality:    Affect:    Cognitive:    Insight:    Engagement in Therapy:    Modes of Intervention:    Summary of Progress/Problems:  Gloria Frank, Gloria Frank 10/01/2014, 1:10 PM

## 2014-10-01 NOTE — Progress Notes (Signed)
[facility]  10/01/2014 11:07 AM Gloria Frank  MRN:  [id number] Subjective:  Pt seen in her room today.Patient is a 44 year old female with history of schizophrenia who was seen for follow-up. She continues to remain bizarre and disorganized and reported to me that she has been living  on the "roads and with friends" and can go back to live with [employer]. . She reported that she feels that she is doing just fine.  She stated that she wants to have contact with her son on a daily basis and was focused on the staff that they should help her call him at 6 PM every evening. Patient remains tangential and was difficult to redirect. She has to be redirected multiple times during the interview. Staff is monitoring her closely. Staff feels that she does not need no pills and is still delusional and paranoid.    Principal Problem: Schizophrenia, unspecified type Diagnosis:   Patient Active Problem List   Diagnosis Date Noted  . Schizophrenia, unspecified type [F20.9] 09/24/2014  . High blood pressure [I10] 09/23/2014   Total Time spent with patient: 30 minutes   Past Medical History: History reviewed. No pertinent past medical history. History reviewed. No pertinent past surgical history. Family History: History reviewed. No pertinent family history. Social History:  History  Alcohol Use No     History  Drug Use No    History   Social History  . Marital Status: Married    Spouse Name: N/A  . Number of Children: N/A  . Years of Education: N/A   Social History Main Topics  . Smoking status: Never Smoker   . Smokeless tobacco: Not on file  . Alcohol Use: No  . Drug Use: No  . Sexual Activity: Not on file   Other Topics Concern  . None   Social History Narrative   Additional History:    Sleep: Fair  Appetite:  Good   Assessment:   Musculoskeletal: Strength & Muscle Tone: within normal limits Gait & Station: normal Patient leans:  N/A   Psychiatric Specialty Exam: Physical Exam  Review of Systems  Constitutional: Negative.   HENT: Negative for congestion.   Eyes: Negative.   Respiratory: Negative for cough.   Cardiovascular: Negative.   Gastrointestinal: Negative.   Genitourinary: Negative.   Musculoskeletal: Negative.   Skin: Negative.   Neurological: Negative.   Endo/Heme/Allergies: Negative.   Psychiatric/Behavioral: Positive for depression and hallucinations. Negative for suicidal ideas and substance abuse. The patient is nervous/anxious. The patient does not have insomnia.     Blood pressure 128/115, pulse 113, temperature 98.1 F (36.7 C), temperature source Oral, resp. rate 20, height 5\' 4"  (1.626 m), weight 79.379 kg (175 lb), SpO2 100 %.Body mass index is 30.02 kg/(m^2).  General Appearance: Fairly Groomed  Patent attorneyye Contact::  Good  Speech:  Clear and Coherent  Volume:  Normal  Mood:  Euthymic  Affect:  Congruent  Thought Process:  Irrelevant, Loose and Tangential and disorganized and is hard to re-direct.  Orientation:  Full (Time, Place, and Person)  Thought Content:  Hallucinations: None but is delusional and paranoid.  Suicidal Thoughts:  No  Homicidal Thoughts:  No  Memory:  Immediate;   Fair Recent;   Fair Remote;   Fair  Judgement:  Impaired  Insight:  Lacking  Psychomotor Activity:  Normal  Concentration:  Fair  Recall:  NA  Fund of Knowledge:Fair  Language: Fair  Akathisia:  No  Handed:    AIMS (  if indicated):     Assets:  Financial Resources/Insurance Vocational/Educational  ADL's:  Intact  Cognition: WNL  Sleep:  Number of Hours: 5     Current Medications: Current Facility-Administered Medications  Medication Dose Route Frequency Provider Last Rate Last Dose  . acetaminophen (TYLENOL) tablet 650 mg  650 mg Oral Q6H PRN Audery Amel, MD   650 mg at 09/29/14 2150  . alum & mag hydroxide-simeth (MAALOX/MYLANTA) 200-200-20 MG/5ML suspension 30 mL  30 mL Oral Q4H PRN Audery Amel, MD      . lisinopril (PRINIVIL,ZESTRIL) tablet 5 mg  5 mg Oral Daily Audery Amel, MD   5 mg at 10/01/14 0943  . loratadine (CLARITIN) tablet 10 mg  10 mg Oral Daily Jimmy Footman, MD   10 mg at 10/01/14 0942  . magnesium hydroxide (MILK OF MAGNESIA) suspension 30 mL  30 mL Oral Daily PRN Audery Amel, MD      . risperiDONE (RISPERDAL) tablet 3 mg  3 mg Oral BID Beau Fanny, MD   3 mg at 09/30/14 2133    Lab Results:  No results found for this or any previous visit (from the past 48 hour(s)).  Physical Findings: AIMS: Facial and Oral Movements Muscles of Facial Expression: None, normal Lips and Perioral Area: None, normal Jaw: None, normal Tongue: None, normal,Extremity Movements Upper (arms, wrists, hands, fingers): None, normal Lower (legs, knees, ankles, toes): None, normal, Trunk Movements Neck, shoulders, hips: None, normal, Overall Severity Severity of abnormal movements (highest score from questions above): None, normal Incapacitation due to abnormal movements: None, normal Patient's awareness of abnormal movements (rate only patient's report): No Awareness, Dental Status Current problems with teeth and/or dentures?: No Does patient usually wear dentures?: No  CIWA:  CIWA-Ar Total: 0 COWS:     Treatment Plan Summary:  44 year old with diagnosis of schizophrenia. Presented to our emergency department under petition due to bizarre behavior and paranoia. Patient is a poor historian at this time and there is no collateral information. Per records looks the patient was in our system backing 2002 for psychosis in [facility] emergency department.  Schizophrenia: Continue Risperdal  And increase dose to 3 mgs po bid..  I will decrease the dose to 3 mg to decrease the side effects of the medications. Minimal improvement since admission.  Continues to be bizarre and disorganized.  Hypertension continue lisinopril 5 mg by mouth daily.  Consider b blocker  instead as HR has been >100  Labs: TSH wnl, lipid panel wnl.  HbA1c is pending.  Refused some of the labs ordered.  Collateral information: son confirmed diagnosis of schizophrenia. They are not very close.  He says she was living in a facility in [place].  Precautions continue every 15 minute checks  Hospitalization status continue involuntary commitment Pt's medication has  been increased on 09/30/2014 and wait for improvement.   Discharge planning: Per family pt was living in a [facility] or ALF prior to admission.   Medical Decision Making:  Continue current meds and is still not showing much improvement and a referral has been made to [facility] for help and SS is trying to contact [facility] for Information and help for plan of treatment.

## 2014-10-01 NOTE — Progress Notes (Signed)
INITIAL NUTRITION ASSESSMENT   INTERVENTION:  Meals and Snacks: encourage menu completion to best meet pt preferences Coordination of care: No further nutrition intervention warranted at this time. Please re-consult RD if nutrition therapy needed.   NUTRITION DIAGNOSIS:  No nutrition diagnosis at this time   GOAL:  Patient will meet greater than or equal to 90% of their needs  MONITOR:  Energy Intake, Anthropometrics   ASSESSMENT:  Gloria Frank is a 44 y.o. female with Schizophrenia, unspecified type  History reviewed. No pertinent past medical history.  Diet Order: regular  Current Nutrition: eating 67% of meals on average   Medications: reviewed  Labs: reviewed    Anthropometrics:   Body mass index is 30.02 kg/(m^2).  Filed Weights   09/23/14 2104  Weight: 175 lb (79.379 kg)      Gloria Frank, RD, LDN [phone] (pager)

## 2014-10-02 LAB — URINALYSIS COMPLETE WITH MICROSCOPIC (ARMC ONLY)
BACTERIA UA: NONE SEEN
BILIRUBIN URINE: NEGATIVE
Glucose, UA: NEGATIVE mg/dL
Ketones, ur: NEGATIVE mg/dL
Leukocytes, UA: NEGATIVE
Nitrite: NEGATIVE
PH: 6 (ref 5.0–8.0)
Protein, ur: NEGATIVE mg/dL
Specific Gravity, Urine: 1.012 (ref 1.005–1.030)

## 2014-10-02 LAB — ACETAMINOPHEN LEVEL: Acetaminophen (Tylenol), Serum: <10 ug/mL — ABNORMAL LOW (ref 10–30)

## 2014-10-02 LAB — SALICYLATE LEVEL

## 2014-10-02 LAB — HEMOGLOBIN: Hemoglobin: 12.4 g/dL (ref 12.0–16.0)

## 2014-10-02 MED ORDER — RISPERIDONE 1 MG PO TBDP
3.0000 mg | ORAL_TABLET | Freq: Every day | ORAL | Status: DC
  Administered 2014-10-02: 3 mg via ORAL
  Filled 2014-10-02: qty 3

## 2014-10-02 NOTE — Progress Notes (Signed)
LCSW faxed over new IVC documents to [facility] along with physicians reports and her vitals as requested by [facility].  LCSW met with patient who was extremely hostile, paranoid and refused to engage with this worker and questioned my race,credetials and ethnicity. Patient then went and asked another LCSW about me. She remained in her room and was not cooperating with the other staff and dismissed [place] both. LCSW encouraged her to talk to [place] if she needed additional support

## 2014-10-02 NOTE — Progress Notes (Signed)
Patient continues to have loose associations and thoughts don't connect. She is denying SI/HI/AVH. Mood is suspicious and guarded. Paces up and down hall frequently wearing patient briefs over her hair. She has remained calm and less argumentative today but is refusing daytime Risperdal. Will continue to monitor.

## 2014-10-02 NOTE — Progress Notes (Signed)
D:   Patient affect is flat and her mood is suspicious.   Patient did attend evening group. Patient visible on the milieu. No distress noted. A: Support and encouragement offered. Scheduled medications given to pt. Q 15 min checks continued for patient safety. R: Patient receptive. Patient remains safe on the unit.

## 2014-10-02 NOTE — Progress Notes (Signed)
Recreation Therapy Notes  Date: 07.20.16 Time: 3:00 pm Location: Craft Room  Group Topic: Self-expression  Goal Area(s) Addresses: Patient will be able to identify a color that represents each emotion. Patient will verbalize benefit of using art as a means of self-expression. Patient will verbalize one positive emotion experienced while participating in activity.   Behavioral Response: Attentive, Disruptive  Intervention: The Colors Within Me  Activity: Patients were given a blank face worksheet and instructed to analyze what emotions they are experiencing, pick a color for each emotion, and color how much of that emotion they are experiencing.  Education: LRT educated patients on different forms of self-expression.   Education Outcome: Acknowledges education/In group clarification offered  Clinical Observations/Feedback: Patient outlined the worksheet in yellow. LRT had to redirect patient multiple times during group for going off topic.  Jacquelynn CreeGreene,Evelin Cake M, LRT/CTRS 10/02/2014 4:06 PM

## 2014-10-02 NOTE — Progress Notes (Signed)
[facility] MD Progress Note  10/02/2014 12:18 PM Gloria Frank  MRN:  [id number] Subjective:  Pt seen in her room today.Patient is a 44 year old female with history of schizophrenia who was seen for follow-up. She continues to remain bizarre and disorganized and reported to me that she has been living  on the "roads and with friends" and can go back to live with [employer]. Pt gets agitated when any questions ar asked. She reported that she feels that she is doing just fine.  She stated that she wants to have contact with her son on a daily basis and was focused on the staff that they should help her call him at 6 PM every evening. Patient remains tangential and was difficult to redirect. She has to be redirected multiple times during the interview. Staff is monitoring her closely. Staff feels that she does not need no pills and is still delusional and paranoid.    Principal Problem: Schizophrenia, unspecified type Diagnosis:   Patient Active Problem List   Diagnosis Date Noted  . Schizophrenia, unspecified type [F20.9] 09/24/2014  . High blood pressure [I10] 09/23/2014   Total Time spent with patient: 30 minutes   Past Medical History: History reviewed. No pertinent past medical history. History reviewed. No pertinent past surgical history. Family History: History reviewed. No pertinent family history. Social History:  History  Alcohol Use No     History  Drug Use No    History   Social History  . Marital Status: Married    Spouse Name: N/A  . Number of Children: N/A  . Years of Education: N/A   Social History Main Topics  . Smoking status: Never Smoker   . Smokeless tobacco: Not on file  . Alcohol Use: No  . Drug Use: No  . Sexual Activity: Not on file   Other Topics Concern  . None   Social History Narrative   Additional History:    Sleep: Fair  Appetite:  Good   Assessment:   Musculoskeletal: Strength & Muscle Tone: within normal limits Gait &  Station: normal Patient leans: N/A   Psychiatric Specialty Exam: Physical Exam  Review of Systems  Constitutional: Negative.   HENT: Negative for congestion.   Eyes: Negative.   Respiratory: Negative for cough.   Cardiovascular: Negative.   Gastrointestinal: Negative.   Genitourinary: Negative.   Musculoskeletal: Negative.   Skin: Negative.   Neurological: Negative.   Endo/Heme/Allergies: Negative.   Psychiatric/Behavioral: Positive for depression and hallucinations. Negative for suicidal ideas and substance abuse. The patient is nervous/anxious. The patient does not have insomnia.     Blood pressure 128/115, pulse 113, temperature 98.1 F (36.7 C), temperature source Oral, resp. rate 20, height  (1.626 m), weight 79.379 kg (175 lb), SpO2 100 %.Body mass index is 30.02 kg/(m^2).  General Appearance: Fairly Groomed  Patent attorney::  Good  Speech:  Clear and Coherent  Volume:  Normal  Mood:  Euthymic  Affect:  Congruent  Thought Process:  Irrelevant, Loose and Tangential and disorganized and is hard to re-direct.  Orientation:  Full (Time, Place, and Person)  Thought Content:  Hallucinations: None but is delusional and paranoid.  Suicidal Thoughts:  No  Homicidal Thoughts:  No  Memory:  Immediate;   Fair Recent;   Fair Remote;   Fair  Judgement:  Impaired  Insight:  Lacking  Psychomotor Activity:  Normal  Concentration:  Fair  Recall:  NA  Fund of Knowledge:Fair  Language: Fair  Akathisia:  No  Handed:    AIMS (if indicated):     Assets:  Financial Resources/Insurance Vocational/Educational  ADL's:  Intact  Cognition: WNL  Sleep:  Number of Hours: 4.5     Current Medications: Current Facility-Administered Medications  Medication Dose Route Frequency Provider Last Rate Last Dose  . acetaminophen (TYLENOL) tablet 650 mg  650 mg Oral Q6H PRN Audery AmelJohn T Clapacs, MD   650 mg at 09/29/14 2150  . alum & mag hydroxide-simeth (MAALOX/MYLANTA) 200-200-20 MG/5ML suspension  30 mL  30 mL Oral Q4H PRN Audery AmelJohn T Clapacs, MD      . lisinopril (PRINIVIL,ZESTRIL) tablet 5 mg  5 mg Oral Daily Audery AmelJohn T Clapacs, MD   5 mg at 10/02/14 1105  . loratadine (CLARITIN) tablet 10 mg  10 mg Oral Daily Jimmy FootmanAndrea Hernandez-Gonzalez, MD   10 mg at 10/02/14 1105  . magnesium hydroxide (MILK OF MAGNESIA) suspension 30 mL  30 mL Oral Daily PRN Audery AmelJohn T Clapacs, MD      . risperiDONE (RISPERDAL) tablet 3 mg  3 mg Oral BID Beau FannySurya K Jordann Grime, MD   3 mg at 10/01/14 2246    Lab Results:  No results found for this or any previous visit (from the past 48 hour(s)).  Physical Findings: AIMS: Facial and Oral Movements Muscles of Facial Expression: None, normal Lips and Perioral Area: None, normal Jaw: None, normal Tongue: None, normal,Extremity Movements Upper (arms, wrists, hands, fingers): None, normal Lower (legs, knees, ankles, toes): None, normal, Trunk Movements Neck, shoulders, hips: None, normal, Overall Severity Severity of abnormal movements (highest score from questions above): None, normal Incapacitation due to abnormal movements: None, normal Patient's awareness of abnormal movements (rate only patient's report): No Awareness, Dental Status Current problems with teeth and/or dentures?: No Does patient usually wear dentures?: No  CIWA:  CIWA-Ar Total: 0 COWS:     Treatment Plan Summary:  44 year old with diagnosis of schizophrenia. Presented to our emergency department under petition due to bizarre behavior and paranoia. Patient is a poor historian at this time and there is no collateral information. Per records looks the patient was in our system backing [facility].  Schizophrenia: Continue Risperdal  And increase dose to 3 mgs po bid..  I will decrease the dose to 3 mg to decrease the side effects of the medications. Minimal improvement since admission.  Continues to be bizarre and disorganized.  Hypertension continue lisinopril 5 mg by  mouth daily.  Consider b blocker instead as HR has been >100  Labs: TSH wnl, lipid panel wnl.  HbA1c is pending.  Refused some of the labs ordered.  Collateral information: son confirmed diagnosis of schizophrenia. They are not very close.  He says she was living in a facility in [place].  Precautions continue every 15 minute checks  Hospitalization status continue involuntary commitment Pt's medication has  been increased on 09/30/2014 and wait for improvement.   Discharge planning: Being considered for [facility]. Labs being ordered    Medical Decision Making:  Continue current meds and is still not showing much improvement and [facility] and SS is trying to contact [facility] for Information and help for plan of treatment.

## 2014-10-02 NOTE — [facility] Group Notes (Signed)
[facility] Group Notes:  (Nursing/MHT/Case Management/Adjunct)  Date:  10/02/2014  Time:  1:29 PM  Type of Therapy:  Psychoeducational Skills  Participation Level:  Did Not Attend   Lynelle SmokeCara Travis [facility] HendersonMadoni 10/02/2014, 1:29 PM

## 2014-10-02 NOTE — Plan of Care (Signed)
Problem: Alteration in thought process Goal: STG-Patient is able to follow short directions Outcome: Progressing Patient responsive to direction today.

## 2014-10-03 MED ORDER — RISPERIDONE 1 MG PO TBDP
5.0000 mg | ORAL_TABLET | Freq: Every day | ORAL | Status: DC
  Administered 2014-10-04: 3 mg via ORAL
  Administered 2014-10-05 – 2014-10-06 (×2): 5 mg via ORAL
  Filled 2014-10-03 (×4): qty 5

## 2014-10-03 NOTE — [facility] Group Notes (Signed)
[facility] LCSW Group Therapy  10/03/2014 4:47 PM  Type of Therapy:  Did not attend  Participation Level:    Participation Quality:    Affect:    Cognitive:    Insight:    Engagement in Therapy:    Modes of Intervention:    Summary of Progress/Problems:  Gloria Frank 10/03/2014, 4:47 PM

## 2014-10-03 NOTE — [facility] Group Notes (Signed)
[facility] Group Notes:  (Nursing/MHT/Case Management/Adjunct)  Date:  10/03/2014  Time:  2:16 AM  Type of Therapy:  Group Therapy  Participation Level:  Active  Participation Quality:  Appropriate and Attentive  Affect:  Appropriate  Cognitive:  Alert and Appropriate  Insight:  Appropriate  Engagement in Group:  Engaged  Modes of Intervention:  Discussion  Summary of Progress/Problems:  Gloria Frank Joy Damoni Causby 10/03/2014, 2:16 AM

## 2014-10-03 NOTE — Progress Notes (Signed)
Recreation Therapy Notes  Date: 07.21.16 Time: 3:00 pm Location: Craft Room  Group Topic: Coping Skills and Leisure Education  Goal Area(s) Addresses:  Patient will identify things they are grateful for. Patient will identify how being grateful can influence decision making.  Behavioral Response: Left early  Intervention: Grateful Wheel  Activity: Patients were given an "I Am Grateful For" worksheet and instructed to listed at least one thing they are grateful for under each category.  Education:LRT educated patients on how being aware of what they are grateful for affects their decision making skills.  Education Outcome: In group clarification offered   Clinical Observations/Feedback: Patient did not work on activity. Patient worked on her menu. Patient left group at approximately 3:19 pm. Patient returned to group at approximately 3:29 pm to get her worksheet and leave. Patient did not return to group.  Jacquelynn Cree, LRT/CTRS 10/03/2014 4:25 PM

## 2014-10-03 NOTE — [facility] Group Notes (Signed)
[facility] Group Notes:  (Nursing/MHT/Case Management/Adjunct)  Date:  10/03/2014  Time:  4:33 PM  Type of Therapy:  Psychoeducational Skills  Participation Level:  Minimal  Participation Quality:  Inattentive  Affect:  Flat and Labile  Cognitive:  Disorganized  Insight:  Improving  Engagement in Group:  Monopolizing  Modes of Intervention:  Support  Summary of Progress/Problems:  Gloria Frank 10/03/2014, 4:33 PM

## 2014-10-03 NOTE — Progress Notes (Signed)
Pt refused 6 am lab draw. Slept 3.15 hours.

## 2014-10-03 NOTE — Plan of Care (Signed)
Problem: Ineffective individual coping Goal: LTG: Patient will report a decrease in negative feelings Outcome: Not Progressing Patient continues to exhibit with disorganized thinking; constantly talking to herself.

## 2014-10-03 NOTE — Tx Team (Signed)
Interdisciplinary Treatment Plan Update (Adult)  Date:  10/03/2014 Time Reviewed:  4:48 PM  Progress in Treatment: Attending groups: No. Participating in groups:  Yes. Taking medication as prescribed:  no Tolerating medication:  unknown Family/Significant othe contact made:  Yes son called Patient understands diagnosis:  no Discussing patient identified problems/goals with staff:  No. Medical problems stabilized or resolved:  Yes. Denies suicidal/homicidal ideation: Yes. Issues/concerns per patient self-inventory:  Yes. Other:  New problem(s) identified: Yes, Describe:  bizarre behaviours  Discharge Plan or Barriers: [facility] wait list, all documentation completed  Reason for Continuation of Hospitalization: Delusions  Hallucinations  Comments:  Estimated length of stay: 7 days  New goal(s):  Review of initial/current patient goals per problem list:   Refer to plan of care  Attendees: Patient:  Gloria Frank 7/21/20164:48 PM  Physician:  Dr  Jennet Maduro  Nursing:   Jerry Caras RN  Case Manager:  Arrie Senate LCSW  Counselor:  Charleston Ropes LCSWA  Other:  Princella Ion LRT   7/21/20164:48 PM   7/21/20164:48 PM   7/21/20164:48 PM   7/21/20164:48 PM   7/21/20164:48 PM   7/21/20164:48 PM  Other:   7/21/20164:48 PM  Other:   7/21/20164:48 PM  Other:  7/21/20164:48 PM  Other:  7/21/20164:48 PM  Other:  7/21/20164:48 PM  Other:  7/21/20164:48 PM  Other:  7/21/20164:48 PM  Other:  7/21/20164:48 PM  Other:   7/21/20164:48 PM   Scribe for Treatment Team:   Cheron Schaumann, 10/03/2014, 4:48 PM

## 2014-10-03 NOTE — Progress Notes (Signed)
Client has been pacing; talking to herself; irritable; and walks away from staff when information is being provided. Today, client has refused labs; refused a.m. Vital signs; and has been intrusive. Client remains on the wait list at [facility].

## 2014-10-03 NOTE — Progress Notes (Signed)
Patient remains paranoid and argumentative with staff. dishelved and malodorous. tangenital with loose association. Pt requesting to change Risperdal tab to Risperdal M-tab. MD notified. Tolerates po medication well. Denies SI/HI. No AV/H noted. No c/o pain/discomfort noted.

## 2014-10-03 NOTE — Progress Notes (Signed)
[facility] MD Progress Note  10/03/2014 12:10 PM Gloria Frank  MRN:  [id number] Subjective:  Pt seen in her room today.Patient is a 44 year old female with history of schizophrenia who was seen for follow-up. She continues to remain bizarre and disorganized and staff reports that she refuses to take meds in AM as she feels drowsy.. Pt gets agitated when any questions ar asked. She reported that she feels that she is doing just fine.  She stated that she wants to go home though she is not sure about the same.. Patient remains tangential and was difficult to redirect. She has to be redirected multiple times during the interview. Staff is monitoring her closely. Staff feels that she does not need no pills and is still delusional and paranoid.    Principal Problem: Schizophrenia, unspecified type Diagnosis:   Patient Active Problem List   Diagnosis Date Noted  . Schizophrenia, unspecified type [F20.9] 09/24/2014  . High blood pressure [I10] 09/23/2014   Total Time spent with patient: 30 minutes   Past Medical History: History reviewed. No pertinent past medical history. History reviewed. No pertinent past surgical history. Family History: History reviewed. No pertinent family history. Social History:  History  Alcohol Use No     History  Drug Use No    History   Social History  . Marital Status: Married    Spouse Name: N/A  . Number of Children: N/A  . Years of Education: N/A   Social History Main Topics  . Smoking status: Never Smoker   . Smokeless tobacco: Not on file  . Alcohol Use: No  . Drug Use: No  . Sexual Activity: Not on file   Other Topics Concern  . None   Social History Narrative   Additional History:    Sleep: Fair  Appetite:  Good   Assessment:   Musculoskeletal: Strength & Muscle Tone: within normal limits Gait & Station: normal Patient leans: N/A   Psychiatric Specialty Exam: Physical Exam  Nursing note and vitals reviewed.   Review of  Systems  Constitutional: Negative.   HENT: Negative for congestion.   Eyes: Negative.   Respiratory: Negative for cough.   Cardiovascular: Negative.   Gastrointestinal: Negative.   Genitourinary: Negative.   Musculoskeletal: Negative.   Skin: Negative.   Neurological: Negative.   Endo/Heme/Allergies: Negative.   Psychiatric/Behavioral: Positive for depression and hallucinations. Negative for suicidal ideas and substance abuse. The patient is nervous/anxious. The patient does not have insomnia.     Blood pressure 128/115, pulse 113, temperature 98.1 F (36.7 C), temperature source Oral, resp. rate 20, height 5\' 4"  (1.626 m), weight 79.379 kg (175 lb), SpO2 100 %.Body mass index is 30.02 kg/(m^2).  General Appearance: Fairly Groomed  Patent attorney::  Good  Speech:  Clear and Coherent  Volume:  Normal  Mood:  Euthymic  Affect:  Congruent  Thought Process:  Irrelevant, Loose and Tangential and disorganized and is hard to re-direct.  Orientation:  Full (Time, Place, and Person)  Thought Content:  Hallucinations: None but is delusional and paranoid.  Suicidal Thoughts:  No  Homicidal Thoughts:  No  Memory:  Immediate;   Fair Recent;   Fair Remote;   Fair  Judgement:  Impaired  Insight:  Lacking  Psychomotor Activity:  Normal  Concentration:  Fair  Recall:  NA  Fund of Knowledge:Fair  Language: Fair  Akathisia:  No  Handed:    AIMS (if indicated):     Assets:  Financial Resources/Insurance Vocational/Educational  ADL's:  Intact  Cognition: WNL  Sleep:  Number of Hours: 3.15     Current Medications: Current Facility-Administered Medications  Medication Dose Route Frequency Provider Last Rate Last Dose  . acetaminophen (TYLENOL) tablet 650 mg  650 mg Oral Q6H PRN Audery Amel, MD   650 mg at 09/29/14 2150  . alum & mag hydroxide-simeth (MAALOX/MYLANTA) 200-200-20 MG/5ML suspension 30 mL  30 mL Oral Q4H PRN Audery Amel, MD      . lisinopril (PRINIVIL,ZESTRIL) tablet 5 mg   5 mg Oral Daily Audery Amel, MD   5 mg at 10/03/14 0917  . loratadine (CLARITIN) tablet 10 mg  10 mg Oral Daily Jimmy Footman, MD   10 mg at 10/03/14 0917  . magnesium hydroxide (MILK OF MAGNESIA) suspension 30 mL  30 mL Oral Daily PRN Audery Amel, MD      . risperiDONE (RISPERDAL M-TABS) disintegrating tablet 3 mg  3 mg Oral QHS Audery Amel, MD   3 mg at 10/02/14 2319    Lab Results:  Results for orders placed or performed during the hospital encounter of 09/23/14 (from the past 48 hour(s))  Hemoglobin     Status: None   Collection Time: 10/02/14  1:16 PM  Result Value Ref Range   Hemoglobin 12.4 12.0 - 16.0 g/dL  Acetaminophen level     Status: Abnormal   Collection Time: 10/02/14  1:16 PM  Result Value Ref Range   Acetaminophen (Tylenol), Serum <10 (L) 10 - 30 ug/mL    Comment:        THERAPEUTIC CONCENTRATIONS VARY SIGNIFICANTLY. A RANGE OF 10-30 ug/mL MAY BE AN EFFECTIVE CONCENTRATION FOR MANY PATIENTS. HOWEVER, SOME ARE BEST TREATED AT CONCENTRATIONS OUTSIDE THIS RANGE. ACETAMINOPHEN CONCENTRATIONS >150 ug/mL AT 4 HOURS AFTER INGESTION AND >50 ug/mL AT 12 HOURS AFTER INGESTION ARE OFTEN ASSOCIATED WITH TOXIC REACTIONS.   Salicylate level     Status: None   Collection Time: 10/02/14  1:16 PM  Result Value Ref Range   Salicylate Lvl <4.0 2.8 - 30.0 mg/dL  Urinalysis complete, with microscopic ([facility] only)     Status: Abnormal   Collection Time: 10/02/14  2:31 PM  Result Value Ref Range   Color, Urine YELLOW (A) YELLOW   APPearance CLEAR (A) CLEAR   Glucose, UA NEGATIVE NEGATIVE mg/dL   Bilirubin Urine NEGATIVE NEGATIVE   Ketones, ur NEGATIVE NEGATIVE mg/dL   Specific Gravity, Urine 1.012 1.005 - 1.030   Hgb urine dipstick 3+ (A) NEGATIVE   pH 6.0 5.0 - 8.0   Protein, ur NEGATIVE NEGATIVE mg/dL   Nitrite NEGATIVE NEGATIVE   Leukocytes, UA NEGATIVE NEGATIVE   RBC / HPF TOO NUMEROUS TO COUNT 0 - 5 RBC/hpf   WBC, UA 6-30 0 - 5 WBC/hpf    Bacteria, UA NONE SEEN NONE SEEN   Squamous Epithelial / LPF 0-5 (A) NONE SEEN   Mucous PRESENT     Physical Findings: AIMS: Facial and Oral Movements Muscles of Facial Expression: None, normal Lips and Perioral Area: None, normal Jaw: None, normal Tongue: None, normal,Extremity Movements Upper (arms, wrists, hands, fingers): None, normal Lower (legs, knees, ankles, toes): None, normal, Trunk Movements Neck, shoulders, hips: None, normal, Overall Severity Severity of abnormal movements (highest score from questions above): None, normal Incapacitation due to abnormal movements: None, normal Patient's awareness of abnormal movements (rate only patient's report): No Awareness, Dental Status Current problems with teeth and/or dentures?: No Does patient usually wear dentures?: No  CIWA:  CIWA-Ar  Total: 0 COWS:     Treatment Plan Summary:  44 year old with diagnosis of schizophrenia. Presented to our emergency department under petition due to bizarre behavior and paranoia. Patient is a poor historian at this time and there is no collateral information. Per records looks the patient was in our system backing 2002 for psychosis in [facility] emergency department.  Schizophrenia: Continue Risperdal  And adjust Risperidone to 5 mgs po hs and D/C am dose.  I will decrease the dose to 3 mg to decrease the side effects of the medications. Minimal improvement since admission.  Continues to be bizarre and disorganized.  Hypertension continue lisinopril 5 mg by mouth daily.  Consider b blocker instead as HR has been >100  Labs: TSH wnl, lipid panel wnl.  HbA1c is pending.  Refused some of the labs ordered.  Collateral information: son confirmed diagnosis of schizophrenia. They are not very close.  He says she was living in a facility in [place].  Precautions continue every 15 minute checks  Hospitalization status continue involuntary commitment Pt's medication has  been increased on 09/30/2014  and wait for improvement.   Discharge planning: Being considered for [facility]. Labs being ordered    Medical Decision Making:  Continue current meds and is still not showing much improvement and a referral has been made to [facility] for help and SS is trying to contact [facility] for Information and help for plan of treatment.

## 2014-10-04 MED ORDER — LORAZEPAM 1 MG PO TABS
1.0000 mg | ORAL_TABLET | Freq: Four times a day (QID) | ORAL | Status: DC | PRN
  Administered 2014-10-04: 1 mg via ORAL
  Filled 2014-10-04: qty 1

## 2014-10-04 MED ORDER — HALOPERIDOL 2 MG PO TABS
2.0000 mg | ORAL_TABLET | Freq: Four times a day (QID) | ORAL | Status: DC | PRN
  Administered 2014-10-04: 2 mg via ORAL
  Filled 2014-10-04: qty 1

## 2014-10-04 MED ORDER — HALOPERIDOL LACTATE 5 MG/ML IJ SOLN
2.0000 mg | Freq: Four times a day (QID) | INTRAMUSCULAR | Status: DC | PRN
  Filled 2014-10-04: qty 1

## 2014-10-04 MED ORDER — LORAZEPAM 2 MG/ML IJ SOLN
1.0000 mg | Freq: Four times a day (QID) | INTRAMUSCULAR | Status: DC | PRN
  Filled 2014-10-04: qty 1

## 2014-10-04 NOTE — [facility] Group Notes (Signed)
[facility] LCSW Aftercare Discharge Planning Group Note   10/04/2014 3:59 PM  Participation Quality:  Pt attended very briefly. She interrupted other group members to ask if we were going to discuss coping skills.  After being told numerous times what we were discussing, she was asked to leave the group.    Gloria Frank L Viveka Wilmeth MSW, [address]

## 2014-10-04 NOTE — Progress Notes (Signed)
Pt remains argumentative and intrusive. Pacing the unit. Refused PO bedtime medications. C/o sore throat. Tylenol 650 mg po given as ordered PRN at 0127. Denies SI/HI. No AV/H noted. Will continue to monitor behavior. Slept 3.30 hours.

## 2014-10-04 NOTE — Plan of Care (Signed)
Problem: Ineffective individual coping Goal: STG: Patient will remain free from self harm Outcome: Not Progressing No injuries noted. No voiced thoughts of hurting herself. q 15 min checks maintained for safety. Will continue to monitor behavior.

## 2014-10-04 NOTE — [facility] Group Notes (Signed)
[facility] Group Notes:  (Nursing/MHT/Case Management/Adjunct)  Date:  10/04/2014  Time:  11:37 AM  Type of Therapy:  Group Therapy  Participation Level:  None  Participation Quality:  Inattentive  Affect:  Defensive and Irritable  Cognitive:  Confused and Delusional  Insight:  Lacking  Engagement in Group:  Lacking  Modes of Intervention:  Activity  Summary of Progress/Problems:  Leno Mathes De'Chelle Newell Frater 10/04/2014, 11:37 AM

## 2014-10-04 NOTE — Progress Notes (Signed)
[facility] MD Progress Note  10/04/2014 10:51 AM Gloria Frank  MRN:  [id number] Subjective:  Pt seen in the [address]. She has been refusing her meds which is Risperidone 5 mgs at hs./ Very argumentative and agitated and paranoid and talking about getting Nursing Satff fired as she feels they do not have Licence to practice and give her meds. Patient is vry paranoid to the extent that she has been refusing the meds though she was expalined about the same. She continues to remain bizarre and disorganized and staff reports that she continues  to  refuses to take meds in AM as she feels drowsy.. Pt gets agitated when any questions ar asked   She stated  That people just brought her here and was questioning if the staff are employed by [facility] or not.  Patient remains tangential and was difficult to redirect. She has to be redirected multiple times during the interview. Staff is monitoring her closely. Pt was given oral pills ie Ativan and Haldol which are prn meds by Staff in front of undersigned but it is doubtful if she is swallowing or not as she repeatedly asked questions about water and the pills.    Principal Problem: Schizophrenia, unspecified type Diagnosis:   Patient Active Problem List   Diagnosis Date Noted  . Schizophrenia, unspecified type [F20.9] 09/24/2014  . High blood pressure [I10] 09/23/2014   Total Time spent with patient: 30 minutes   Past Medical History: History reviewed. No pertinent past medical history. History reviewed. No pertinent past surgical history. Family History: History reviewed. No pertinent family history. Social History:  History  Alcohol Use No     History  Drug Use No    History   Social History  . Marital Status: Married    Spouse Name: N/A  . Number of Children: N/A  . Years of Education: N/A   Social History Main Topics  . Smoking status: Never Smoker   . Smokeless tobacco: Not on file  . Alcohol Use: No  . Drug Use: No   . Sexual Activity: Not on file   Other Topics Concern  . None   Social History Narrative   Additional History:    Sleep: Fair  Appetite:  Good   Assessment:   Musculoskeletal: Strength & Muscle Tone: within normal limits Gait & Station: normal Patient leans: N/A   Psychiatric Specialty Exam: Physical Exam  Nursing note and vitals reviewed.   Review of Systems  Constitutional: Negative.   HENT: Negative for congestion.   Eyes: Negative.   Respiratory: Negative for cough.   Cardiovascular: Negative.   Gastrointestinal: Negative.   Genitourinary: Negative.   Musculoskeletal: Negative.   Skin: Negative.   Neurological: Negative.   Endo/Heme/Allergies: Negative.   Psychiatric/Behavioral: Positive for depression and hallucinations. Negative for suicidal ideas and substance abuse. The patient is nervous/anxious. The patient does not have insomnia.     Blood pressure 129/87, pulse 78, temperature 98.7 F (37.1 C), temperature source Oral, resp. rate 20, height 5\' 4"  (1.626 m), weight 79.379 kg (175 lb), SpO2 100 %.Body mass index is 30.02 kg/(m^2).  General Appearance: Fairly Groomed  Patent attorney::  Good  Speech:  Clear and Coherent  Volume:  Normal  Mood:  Euthymic  Affect:  Congruent  Thought Process:  Irrelevant, Loose and Tangential and disorganized and is hard to re-direct.  Orientation:  Oriented with help and gets paranoid when such questions are asked.  Thought Content:  Denies hallucinations and  infact gets upset and angry when suck questions are asked. but is delusional and paranoid.  Suicidal Thoughts:  No  Homicidal Thoughts:  No  Memory:  Immediate;   Fair Recent;   Fair Remote;   Fair  Judgement:  Impaired  Insight:  Lacking  Psychomotor Activity:  Normal  Concentration:  Fair  Recall:  NA  Fund of Knowledge:Fair  Language: Fair  Akathisia:  No  Handed:    AIMS (if indicated):     Assets:  Financial Resources/Insurance Vocational/Educational   ADL's:  Intact  Cognition: WNL  Sleep:  Number of Hours: 3.3     Current Medications: Current Facility-Administered Medications  Medication Dose Route Frequency Provider Last Rate Last Dose  . acetaminophen (TYLENOL) tablet 650 mg  650 mg Oral Q6H PRN Audery Amel, MD   650 mg at 10/04/14 0127  . alum & mag hydroxide-simeth (MAALOX/MYLANTA) 200-200-20 MG/5ML suspension 30 mL  30 mL Oral Q4H PRN Audery Amel, MD      . haloperidol (HALDOL) tablet 2 mg  2 mg Oral Q6H PRN Beau Fanny, MD   2 mg at 10/04/14 1022   Or  . haloperidol lactate (HALDOL) injection 2 mg  2 mg Intramuscular Q6H PRN Beau Fanny, MD      . lisinopril (PRINIVIL,ZESTRIL) tablet 5 mg  5 mg Oral Daily Audery Amel, MD   5 mg at 10/04/14 0850  . loratadine (CLARITIN) tablet 10 mg  10 mg Oral Daily Jimmy Footman, MD   10 mg at 10/04/14 0850  . LORazepam (ATIVAN) tablet 1 mg  1 mg Oral Q6H PRN Beau Fanny, MD   1 mg at 10/04/14 1021   Or  . LORazepam (ATIVAN) injection 1 mg  1 mg Intramuscular Q6H PRN Beau Fanny, MD      . magnesium hydroxide (MILK OF MAGNESIA) suspension 30 mL  30 mL Oral Daily PRN Audery Amel, MD      . risperiDONE (RISPERDAL M-TABS) disintegrating tablet 5 mg  5 mg Oral QHS Beau Fanny, MD   3 mg at 10/04/14 1610    Lab Results:  Results for orders placed or performed during the hospital encounter of 09/23/14 (from the past 48 hour(s))  Hemoglobin     Status: None   Collection Time: 10/02/14  1:16 PM  Result Value Ref Range   Hemoglobin 12.4 12.0 - 16.0 g/dL  Acetaminophen level     Status: Abnormal   Collection Time: 10/02/14  1:16 PM  Result Value Ref Range   Acetaminophen (Tylenol), Serum <10 (L) 10 - 30 ug/mL    Comment:        THERAPEUTIC CONCENTRATIONS VARY SIGNIFICANTLY. A RANGE OF 10-30 ug/mL MAY BE AN EFFECTIVE CONCENTRATION FOR MANY PATIENTS. HOWEVER, SOME ARE BEST TREATED AT CONCENTRATIONS OUTSIDE THIS RANGE. ACETAMINOPHEN CONCENTRATIONS >150  ug/mL AT 4 HOURS AFTER INGESTION AND >50 ug/mL AT 12 HOURS AFTER INGESTION ARE OFTEN ASSOCIATED WITH TOXIC REACTIONS.   Salicylate level     Status: None   Collection Time: 10/02/14  1:16 PM  Result Value Ref Range   Salicylate Lvl <4.0 2.8 - 30.0 mg/dL  Urinalysis complete, with microscopic ([facility] only)     Status: Abnormal   Collection Time: 10/02/14  2:31 PM  Result Value Ref Range   Color, Urine YELLOW (A) YELLOW   APPearance CLEAR (A) CLEAR   Glucose, UA NEGATIVE NEGATIVE mg/dL   Bilirubin Urine NEGATIVE NEGATIVE   Ketones, ur NEGATIVE  NEGATIVE mg/dL   Specific Gravity, Urine 1.012 1.005 - 1.030   Hgb urine dipstick 3+ (A) NEGATIVE   pH 6.0 5.0 - 8.0   Protein, ur NEGATIVE NEGATIVE mg/dL   Nitrite NEGATIVE NEGATIVE   Leukocytes, UA NEGATIVE NEGATIVE   RBC / HPF TOO NUMEROUS TO COUNT 0 - 5 RBC/hpf   WBC, UA 6-30 0 - 5 WBC/hpf   Bacteria, UA NONE SEEN NONE SEEN   Squamous Epithelial / LPF 0-5 (A) NONE SEEN   Mucous PRESENT     Physical Findings: AIMS: Facial and Oral Movements Muscles of Facial Expression: None, normal Lips and Perioral Area: None, normal Jaw: None, normal Tongue: None, normal,Extremity Movements Upper (arms, wrists, hands, fingers): None, normal Lower (legs, knees, ankles, toes): None, normal, Trunk Movements Neck, shoulders, hips: None, normal, Overall Severity Severity of abnormal movements (highest score from questions above): None, normal Incapacitation due to abnormal movements: None, normal Patient's awareness of abnormal movements (rate only patient's report): No Awareness, Dental Status Current problems with teeth and/or dentures?: No Does patient usually wear dentures?: No  CIWA:  CIWA-Ar Total: 0 COWS:     Treatment Plan Summary:  44 year old with diagnosis of schizophrenia. Presented to our emergency department under petition due to bizarre behavior and paranoia. Patient is a poor historian at this time and there is no collateral  information. Per records looks the patient was in our system backing 2002 for psychosis in [facility] emergency department.  Schizophrenia: Continue current ,meds and adjust the dose and give prn Haldol and Ativan po or IM to help calm down and adjust and continue the meds Continues to be bizarre and disorganized.  Hypertension continue lisinopril 5 mg by mouth daily.  Consider b blocker instead as HR has been >100  Labs: TSH wnl, lipid panel wnl.  HbA1c is pending.  Refused some of the labs ordered.  Collateral information: son confirmed diagnosis of schizophrenia. They are not very close.  He says she was living in a facility in [place].  Precautions continue every 15 minute checks  Hospitalization status continue involuntary commitment Pt's medication has  been increased on 09/30/2014 and wait for improvement.   Discharge planning: Being considered for [facility]. Labs being ordered    Medical Decision Making:  Continue current meds and is still not showing much improvement and a referral has been made to [facility] for help and SS is trying to contact [facility] for Information and help for plan of treatment.

## 2014-10-04 NOTE — Progress Notes (Signed)
Patient was agitated this am.  She was noticed to have been pacing the hallway.  She was making inappropriate demands and was noted to have put her trash in hallway and acting bizzar.  Patient took her prn medication for agitation after much coaxing.  Appeared to have calmed her down.  Patient is currently resting quietly in her room.  Will cont to monitor for safety.

## 2014-10-04 NOTE — [facility] Group Notes (Signed)
[facility] LCSW Group Therapy (Late entry from 10/03/2014)  10/04/2014 3:58 PM  Type of Therapy:  Group Therapy  Participation Level:  Did Not Attend  Modes of Intervention:  Discussion, Education, Socialization and Support  Summary of Progress/Problems: Emotional Regulation: Patients will identify both negative and positive emotions. They will discuss emotions they have difficulty regulating and how they impact their lives. Patients will be asked to identify healthy coping skills to combat unhealthy reactions to negative emotions.     Daisy Floro Glynna Failla MSW, LCSWA  10/04/2014, 3:58 PM

## 2014-10-04 NOTE — [facility] Group Notes (Signed)
[facility] Group Notes:  (Nursing/MHT/Case Management/Adjunct)  Date:  10/04/2014  Time:  5:59 AM  Type of Therapy:  Group Therapy  Participation Level:  Active  Participation Quality:  Redirectable  Affect:  Not Congruent  Cognitive:  Disorganized, Confused and Delusional  Insight:  Limited  Engagement in Group:  Limited and Off Topic  Modes of Intervention:  Discussion  Summary of Progress/Problems:  Gloria Frank 10/04/2014, 5:59 AM

## 2014-10-04 NOTE — [facility] Group Notes (Signed)
[facility] LCSW Group Therapy  10/04/2014 3:38 PM  Type of Therapy:  Group Therapy  Participation Level:  Did Not Attend   Lorynn Moeser T, MSW, LCSWA 10/04/2014, 3:38 PM  

## 2014-10-04 NOTE — Progress Notes (Signed)
Recreation Therapy Notes  Date: 07.22.16 Time: 3:00 pm Location: Craft Room  Group Topic: [place]  Goal Area(s) Addresses:  Patients will learn a healthy coping skill. Patients will verbalized benefit of coloring.  Behavioral Response: Did not attend  Intervention: [place]  Activity: Patients colored a mandala to learn a new coping skill.  Education: LRT educated patients on the benefits of coloring.   Education Outcome: Patient did not attend group.  Clinical Observations/Feedback: Patient did not attend group.  Jacquelynn Cree, LRT/CTRS 10/04/2014 4:08 PM

## 2014-10-05 NOTE — Progress Notes (Signed)
Patient unwilling to participate in assessment process. Refused medications. Notes she is not happy with the way her medications are prescribed and notes her real doctors are not "here", then mentioned [place]. Agitated and intrusive for a short period, then spontaneously back to bed for the remainder of the night.

## 2014-10-05 NOTE — Progress Notes (Signed)
[facility] MD Progress Note  10/05/2014 3:08 PM Gloria Frank  MRN:  [id number] Subjective:  Follow-up for this 44 year old woman with schizophrenia as well as high blood pressure Principal Problem: Schizophrenia, unspecified type Diagnosis:   Patient Active Problem List   Diagnosis Date Noted  . Schizophrenia, unspecified type [F20.9] 09/24/2014  . High blood pressure [I10] 09/23/2014   Total Time spent with patient: 30 minutes   Past Medical History: History reviewed. No pertinent past medical history. History reviewed. No pertinent past surgical history. Family History: History reviewed. No pertinent family history. Social History:  History  Alcohol Use No     History  Drug Use No    History   Social History  . Marital Status: Married    Spouse Name: N/A  . Number of Children: N/A  . Years of Education: N/A   Social History Main Topics  . Smoking status: Never Smoker   . Smokeless tobacco: Not on file  . Alcohol Use: No  . Drug Use: No  . Sexual Activity: Not on file   Other Topics Concern  . None   Social History Narrative   Additional History:   patient is gamy about whether she is taking her medicine. There is reason to think she might still be spitting out her Risperdal although she has said that she is taking it. She is only intermittently cooperative with medication. Patient remains very psychotic. Made comments during our interview including saying that she was dead and that she wanted to talk to a "medical examiner" because she was dead. Couldn't acknowledge that this made no sense. No insight. Unable to give any kind of rational history about recent behavior  Sleep: Fair  Appetite:  Fair   Assessment: Patient appears to have schizophrenia with probably a history of an adequate treatment with no insight. Still very psychotic. Not able to reach anyone else in the family who may help with discharge planning either  Musculoskeletal: Strength & Muscle  Tone: within normal limits Gait & Station: normal Patient leans: N/A   Psychiatric Specialty Exam: Physical Exam  Constitutional: She appears well-developed and well-nourished.  HENT:  Head: Normocephalic and atraumatic.  Eyes: Conjunctivae are normal. Pupils are equal, round, and reactive to light.  Neck: Normal range of motion.  Cardiovascular: Normal heart sounds.   Respiratory: Effort normal.  GI: Soft.  Musculoskeletal: Normal range of motion.  Neurological: She is alert.  Skin: Skin is warm and dry.  Psychiatric: Her affect is angry and blunt. Her speech is delayed. She is withdrawn. Thought content is paranoid and delusional. Cognition and memory are impaired. She expresses inappropriate judgment.    Review of Systems  Constitutional: Negative.   HENT: Negative.   Eyes: Negative.   Respiratory: Negative.   Cardiovascular: Negative.   Gastrointestinal: Negative.   Musculoskeletal: Negative.   Skin: Negative.   Neurological: Negative.   Psychiatric/Behavioral: Negative for depression, suicidal ideas, hallucinations, memory loss and substance abuse. The patient is not nervous/anxious and does not have insomnia.     Blood pressure 129/87, pulse 78, temperature 98.7 F (37.1 C), temperature source Oral, resp. rate 20, height  (1.626 m), weight 79.379 kg (175 lb), SpO2 100 %.Body mass index is 30.02 kg/(m^2).  General Appearance: Bizarre  Eye Contact::  Fair  Speech:  Normal Rate  Volume:  Decreased  Mood:  Irritable  Affect:  Flat  Thought Process:  Disorganized  Orientation:  Full (Time, Place, and Person)  Thought Content:  Delusions  Suicidal Thoughts:  No  Homicidal Thoughts:  No  Memory:  Immediate;   Good Recent;   Fair Remote;   Poor  Judgement:  Impaired  Insight:  Lacking  Psychomotor Activity:  Decreased  Concentration:  Poor  Recall:  Poor  Fund of Knowledge:Poor  Language: Poor  Akathisia:  No  Handed:  Right  AIMS (if indicated):      Assets:  Communication Skills Physical Health  ADL's:  Intact  Cognition: WNL  Sleep:  Number of Hours: 6     Current Medications: Current Facility-Administered Medications  Medication Dose Route Frequency Provider Last Rate Last Dose  . acetaminophen (TYLENOL) tablet 650 mg  650 mg Oral Q6H PRN Audery Amel, MD   650 mg at 10/04/14 0127  . alum & mag hydroxide-simeth (MAALOX/MYLANTA) 200-200-20 MG/5ML suspension 30 mL  30 mL Oral Q4H PRN Audery Amel, MD      . haloperidol (HALDOL) tablet 2 mg  2 mg Oral Q6H PRN Beau Fanny, MD   2 mg at 10/04/14 1022   Or  . haloperidol lactate (HALDOL) injection 2 mg  2 mg Intramuscular Q6H PRN Beau Fanny, MD      . lisinopril (PRINIVIL,ZESTRIL) tablet 5 mg  5 mg Oral Daily Audery Amel, MD   5 mg at 10/05/14 0926  . loratadine (CLARITIN) tablet 10 mg  10 mg Oral Daily Jimmy Footman, MD   10 mg at 10/05/14 0926  . LORazepam (ATIVAN) tablet 1 mg  1 mg Oral Q6H PRN Beau Fanny, MD   1 mg at 10/04/14 1021   Or  . LORazepam (ATIVAN) injection 1 mg  1 mg Intramuscular Q6H PRN Beau Fanny, MD      . magnesium hydroxide (MILK OF MAGNESIA) suspension 30 mL  30 mL Oral Daily PRN Audery Amel, MD      . risperiDONE (RISPERDAL M-TABS) disintegrating tablet 5 mg  5 mg Oral QHS Beau Fanny, MD   3 mg at 10/04/14 1610    Lab Results: No results found for this or any previous visit (from the past 48 hour(s)).  Physical Findings: AIMS: Facial and Oral Movements Muscles of Facial Expression: None, normal Lips and Perioral Area: None, normal Jaw: None, normal Tongue: None, normal,Extremity Movements Upper (arms, wrists, hands, fingers): None, normal Lower (legs, knees, ankles, toes): None, normal, Trunk Movements Neck, shoulders, hips: None, normal, Overall Severity Severity of abnormal movements (highest score from questions above): None, normal Incapacitation due to abnormal movements: None, normal Patient's awareness of  abnormal movements (rate only patient's report): No Awareness, Dental Status Current problems with teeth and/or dentures?: No Does patient usually wear dentures?: No  CIWA:  CIWA-Ar Total: 0 COWS:     Treatment Plan Summary: Medication management and Plan Patient continues to be very psychotic. Unclear if she is taking her medicine. Continue current Risperdal 5 mg in the evening. Continue management of blood pressure. May need some increases in blood pressure medicine if that doesn't improve. Psychoeducation and review of treatment plan with the patient despite the fact that she seems to confused to really understand it   Medical Decision Making:  Established Problem, Worsening (2), Review of Medication Regimen & Side Effects (2) and Review of New Medication or Change in Dosage (2)     Hallie Ertl 10/05/2014, 3:08 PM

## 2014-10-05 NOTE — Progress Notes (Signed)
Pt has been pleasant and cooperative although some what seclusive to her room in the AM. Pt has not  attendedany unit activities. Pt's mood and affect has been depressed. Pt denies SI and A/V hallucinations. Pt is able to contract for  Safety.

## 2014-10-05 NOTE — [facility] Group Notes (Signed)
[facility] Group Notes:  (Nursing/MHT/Case Management/Adjunct)  Date:  10/05/2014  Time:  3:09 AM  Type of Therapy:  Group Therapy  Participation Level:  Did Not Attend    Summary of Progress/Problems:  Nikkol Pai Imani Diamante Truszkowski 10/05/2014, 3:09 AM 

## 2014-10-05 NOTE — Plan of Care (Signed)
Problem: Ineffective individual coping Goal: LTG: Patient will report a decrease in negative feelings Outcome: Not Progressing No progress. Goal: STG: Pt will be able to identify effective and ineffective STG: Pt will be able to identify effective and ineffective coping patterns  Outcome: Not Progressing Patient does not acknowledge any need for coping skills. She is blaming of others.  Goal: STG: Patient will remain free from self harm Outcome: Progressing No self harm.

## 2014-10-05 NOTE — [facility] Group Notes (Signed)
[facility] LCSW Group Therapy  10/05/2014 2:17 PM  Type of Therapy:  Group Therapy  Participation Level:  Did Not Attend  Modes of Intervention:  Discussion, Education, Socialization and Support  Summary of Progress/Problems: Communications: Patients identify how individuals communicate with one another appropriately and inappropriately. Patients will be guided to discuss their thoughts, feelings, and behaviors related to barriers when communicating. The group will process together ways to execute positive and appropriate communications   [employer] MSW, LCSWA  10/05/2014, 2:17 PM

## 2014-10-06 NOTE — Progress Notes (Signed)
[facility] MD Progress Note  10/06/2014 11:29 PM Gloria Frank  MRN:  [id number] Subjective:  Follow-up for this 44 year old woman with schizophrenia as well as high blood pressure Principal Problem: Schizophrenia, unspecified type Diagnosis:   Patient Active Problem List   Diagnosis Date Noted  . Schizophrenia, unspecified type [F20.9] 09/24/2014  . High blood pressure [I10] 09/23/2014   Total Time spent with patient: 30 minutes   Past Medical History: History reviewed. No pertinent past medical history. History reviewed. No pertinent past surgical history. Family History: History reviewed. No pertinent family history. Social History:  History  Alcohol Use No     History  Drug Use No    History   Social History  . Marital Status: Married    Spouse Name: N/A  . Number of Children: N/A  . Years of Education: N/A   Social History Main Topics  . Smoking status: Never Smoker   . Smokeless tobacco: Not on file  . Alcohol Use: No  . Drug Use: No  . Sexual Activity: Not on file   Other Topics Concern  . None   Social History Narrative   Additional History:   patient remains very psychotic. Once again today she asked me if she could please see a medical examiner or corner. Made several other bizarre comments. Very paranoid seeming. Took her quite a while to be convinced that I was actually the doctor on call today. Seems to be taking her Risperdal as far as we can tell no sign of side effects Sleep: Fair  Appetite:  Fair   Assessment: Patient appears to have schizophrenia with probably a history of an adequate treatment with no insight. Still very psychotic. Not able to reach anyone else in the family who may help with discharge planning either  Musculoskeletal: Strength & Muscle Tone: within normal limits Gait & Station: normal Patient leans: N/A   Psychiatric Specialty Exam: Physical Exam  Constitutional: She appears well-developed and well-nourished.  HENT:   Head: Normocephalic and atraumatic.  Eyes: Conjunctivae are normal. Pupils are equal, round, and reactive to light.  Neck: Normal range of motion.  Cardiovascular: Normal heart sounds.   Respiratory: Effort normal.  GI: Soft.  Musculoskeletal: Normal range of motion.  Neurological: She is alert.  Skin: Skin is warm and dry.  Psychiatric: Her affect is angry and blunt. Her speech is delayed. She is withdrawn. Thought content is paranoid and delusional. Cognition and memory are impaired. She expresses inappropriate judgment.    Review of Systems  Constitutional: Negative.   HENT: Negative.   Eyes: Negative.   Respiratory: Negative.   Cardiovascular: Negative.   Gastrointestinal: Negative.   Musculoskeletal: Negative.   Skin: Negative.   Neurological: Negative.   Psychiatric/Behavioral: Negative for depression, suicidal ideas, hallucinations, memory loss and substance abuse. The patient is not nervous/anxious and does not have insomnia.     Blood pressure 95/62, pulse 80, temperature 98.6 F (37 C), temperature source Oral, resp. rate 18, height  (1.626 m), weight 79.379 kg (175 lb), SpO2 100 %.Body mass index is 30.02 kg/(m^2).  General Appearance: Bizarre  Eye Contact::  Fair  Speech:  Normal Rate  Volume:  Decreased  Mood:  Irritable  Affect:  Flat  Thought Process:  Disorganized  Orientation:  Full (Time, Place, and Person)  Thought Content:  Delusions  Suicidal Thoughts:  No  Homicidal Thoughts:  No  Memory:  Immediate;   Good Recent;   Fair Remote;   Poor  Judgement:  Impaired  Insight:  Lacking  Psychomotor Activity:  Decreased  Concentration:  Poor  Recall:  Poor  Fund of Knowledge:Poor  Language: Poor  Akathisia:  No  Handed:  Right  AIMS (if indicated):     Assets:  Communication Skills Physical Health  ADL's:  Intact  Cognition: WNL  Sleep:  Number of Hours: 6     Current Medications: Current Facility-Administered Medications  Medication Dose  Route Frequency Provider Last Rate Last Dose  . acetaminophen (TYLENOL) tablet 650 mg  650 mg Oral Q6H PRN Audery Amel, MD   650 mg at 10/04/14 0127  . alum & mag hydroxide-simeth (MAALOX/MYLANTA) 200-200-20 MG/5ML suspension 30 mL  30 mL Oral Q4H PRN Audery Amel, MD      . haloperidol (HALDOL) tablet 2 mg  2 mg Oral Q6H PRN Beau Fanny, MD   2 mg at 10/04/14 1022   Or  . haloperidol lactate (HALDOL) injection 2 mg  2 mg Intramuscular Q6H PRN Beau Fanny, MD      . lisinopril (PRINIVIL,ZESTRIL) tablet 5 mg  5 mg Oral Daily Audery Amel, MD   5 mg at 10/06/14 0947  . loratadine (CLARITIN) tablet 10 mg  10 mg Oral Daily Jimmy Footman, MD   10 mg at 10/06/14 0947  . LORazepam (ATIVAN) tablet 1 mg  1 mg Oral Q6H PRN Beau Fanny, MD   1 mg at 10/04/14 1021   Or  . LORazepam (ATIVAN) injection 1 mg  1 mg Intramuscular Q6H PRN Beau Fanny, MD      . magnesium hydroxide (MILK OF MAGNESIA) suspension 30 mL  30 mL Oral Daily PRN Audery Amel, MD   30 mL at 10/06/14 0026  . risperiDONE (RISPERDAL M-TABS) disintegrating tablet 5 mg  5 mg Oral QHS Beau Fanny, MD   5 mg at 10/06/14 2228    Lab Results: No results found for this or any previous visit (from the past 48 hour(s)).  Physical Findings: AIMS: Facial and Oral Movements Muscles of Facial Expression: None, normal Lips and Perioral Area: None, normal Jaw: None, normal Tongue: None, normal,Extremity Movements Upper (arms, wrists, hands, fingers): None, normal Lower (legs, knees, ankles, toes): None, normal, Trunk Movements Neck, shoulders, hips: None, normal, Overall Severity Severity of abnormal movements (highest score from questions above): None, normal Incapacitation due to abnormal movements: None, normal Patient's awareness of abnormal movements (rate only patient's report): No Awareness, Dental Status Current problems with teeth and/or dentures?: No Does patient usually wear dentures?: No  CIWA:   CIWA-Ar Total: 0 COWS:     Treatment Plan Summary: No change to current treatment plan. Supportive counseling completed. Blood pressure remains under good control. In fact is rather low but she is not having any side effects or problems related to it.  Medical Decision Making:  Established Problem, Worsening (2), Review of Medication Regimen & Side Effects (2) and Review of New Medication or Change in Dosage (2)     Sarai January 10/06/2014, 11:29 PM

## 2014-10-06 NOTE — Progress Notes (Signed)
Patient was intrusive this evening, but was med compliant. She had several magazines and came to the nurse's station multiple times to ask questions about articles, including cracked skin and estrogen replacement. She again spoke of "her doctor" not being here, and noted her doctor would have put her on an antibiotic by now and she would be feeling better. She continues to be vague in detail and her thought process is difficult to follow at times.  She chooses not to disclose her feelings when asked about depression, anxiety, SI, HI, and AVH, but is not observed responding to internal stimuli. Med compliant with encouragement.

## 2014-10-06 NOTE — Progress Notes (Signed)
Pt has been pleasant and cooperative although some what seclusive to her room in the AM. Pt has not  attendedany unit activities. Pt's mood and affect has been depressed. Pt denies SI and A/V hallucinations. Pt is able to contract for  Safety.  

## 2014-10-06 NOTE — [facility] Counselor (Signed)
[facility] LCSW Group Therapy  10/06/2014 2:33 PM  Type of Therapy:  Group Therapy  Participation Level:  Did Not Attend  Modes of Intervention:  Discussion, Education, Socialization and Support  Summary of Progress/Problems:Pts were asked to identify what balance means to them. They were encouraged to identify what throws them off balance and how to regain balance.   Gloria Frank L Mande Auvil MSW, LCSWA  10/06/2014, 2:33 PM  

## 2014-10-06 NOTE — Plan of Care (Signed)
Problem: Ineffective individual coping Goal: STG: Patient will remain free from self harm Outcome: Progressing No self-harm.      

## 2014-10-07 MED ORDER — RISPERIDONE 1 MG PO TBDP
6.0000 mg | ORAL_TABLET | Freq: Every day | ORAL | Status: DC
  Administered 2014-10-07 – 2014-10-10 (×4): 6 mg via ORAL
  Filled 2014-10-07 (×4): qty 6

## 2014-10-07 NOTE — Plan of Care (Signed)
Problem: Ineffective individual coping Goal: LTG: Patient will report a decrease in negative feelings Outcome: Progressing Patient is labile and her report of feelings varies throughout the evening.  Goal: STG: Patient will remain free from self harm Outcome: Progressing No self harm.

## 2014-10-07 NOTE — [facility] Group Notes (Signed)
[facility] LCSW Group Therapy  10/07/2014 3:35 PM  Type of Therapy:  Group Therapy  Participation Level:  Active  Participation Quality:  Attentive  Affect:  Flat  Cognitive:  Disorganized  Insight:  Distracting, Limited and Off Topic  Engagement in Therapy:  Distracting, Limited and Off Topic  Modes of Intervention:  Socialization and Support  Summary of Progress/Problems: Patient attended group and attempted to participate but was off topic and disorganized. Patient was redirectable.  Lulu Riding, MSW, LCSWA 10/07/2014, 3:35 PM

## 2014-10-07 NOTE — Progress Notes (Signed)
[facility] MD Progress Note  10/07/2014 12:52 PM Gloria Frank  MRN:  [id number] Subjective:  Follow-up for this 44 year old woman with schizophrenia as well as high blood pressure Pt seen in the Day room and" then in the Office. Cc" Doing fine. Have you seen Police Shootings on the TV."  When questions are asked she answers and most of it disconnected and dis- jointed and does not mak much sense. Staff reports that she has not been agitated and not been argumentative. Principal Problem: Schizophrenia, unspecified type Diagnosis:   Patient Active Problem List   Diagnosis Date Noted  . Schizophrenia, unspecified type [F20.9] 09/24/2014  . High blood pressure [I10] 09/23/2014   Total Time spent with patient: 30 minutes   Past Medical History: History reviewed. No pertinent past medical history. History reviewed. No pertinent past surgical history. Family History: History reviewed. No pertinent family history. Social History:  History  Alcohol Use No     History  Drug Use No    History   Social History  . Marital Status: Married    Spouse Name: N/A  . Number of Children: N/A  . Years of Education: N/A   Social History Main Topics  . Smoking status: Never Smoker   . Smokeless tobacco: Not on file  . Alcohol Use: No  . Drug Use: No  . Sexual Activity: Not on file   Other Topics Concern  . None   Social History Narrative   Additional History:   patient remains very psychotic. Once again today she asked me if she could please see a medical examiner or corner. Made several other bizarre comments. Very paranoid seeming. Took her quite a while to be convinced that I was actually the doctor on call today. Seems to be taking her Risperdal as far as we can tell no sign of side effects Sleep: Fair  Appetite:  Fair   Assessment: Patient appears to have schizophrenia with probably a history of an adequate treatment with no insight. Still very psychotic. Not able to reach anyone  else in the family who may help with discharge planning either  Musculoskeletal: Strength & Muscle Tone: within normal limits Gait & Station: normal Patient leans: N/A   Psychiatric Specialty Exam: Physical Exam  Nursing note and vitals reviewed. Constitutional: She appears well-developed and well-nourished.  HENT:  Head: Normocephalic and atraumatic.  Eyes: Conjunctivae are normal. Pupils are equal, round, and reactive to light.  Neck: Normal range of motion.  Cardiovascular: Normal heart sounds.   Respiratory: Effort normal.  GI: Soft.  Musculoskeletal: Normal range of motion.  Neurological: She is alert.  Skin: Skin is warm and dry.  Psychiatric: Her affect is angry and blunt. Her speech is delayed. She is withdrawn. Thought content is paranoid and delusional. Cognition and memory are impaired. She expresses inappropriate judgment.    Review of Systems  Constitutional: Negative.   HENT: Negative.   Eyes: Negative.   Respiratory: Negative.   Cardiovascular: Negative.   Gastrointestinal: Negative.   Musculoskeletal: Negative.   Skin: Negative.   Neurological: Negative.   Psychiatric/Behavioral: Negative for depression, suicidal ideas, hallucinations, memory loss and substance abuse. The patient is not nervous/anxious and does not have insomnia.     Blood pressure 103/71, pulse 80, temperature 98.6 F (37 C), temperature source Oral, resp. rate 18, height 5\' 4"  (1.626 m), weight 79.379 kg (175 lb), SpO2 100 %.Body mass index is 30.02 kg/(m^2).  General Appearance: Bizarre  Eye Contact::  Fair  Speech:  Normal Rate  Volume:  Decreased  Mood:  Irritable  Affect:  Flat  Thought Process:  Disorganized  Orientation:  Full (Time, Place, and Person)  Thought Content:  Delusions  Suicidal Thoughts:  No  Homicidal Thoughts:  No  Memory:  Immediate;   Good Recent;   Fair Remote;   Poor  Judgement:  Impaired  Insight:  Lacking  Psychomotor Activity:  Decreased   Concentration:  Poor  Recall:  Poor  Fund of Knowledge:Poor  Language: Poor  Akathisia:  No  Handed:  Right  AIMS (if indicated):     Assets:  Communication Skills Physical Health  ADL's:  Intact  Cognition: WNL  Sleep:  Number of Hours: 5     Current Medications: Current Facility-Administered Medications  Medication Dose Route Frequency Provider Last Rate Last Dose  . acetaminophen (TYLENOL) tablet 650 mg  650 mg Oral Q6H PRN Audery Amel, MD   650 mg at 10/04/14 0127  . alum & mag hydroxide-simeth (MAALOX/MYLANTA) 200-200-20 MG/5ML suspension 30 mL  30 mL Oral Q4H PRN Audery Amel, MD      . haloperidol (HALDOL) tablet 2 mg  2 mg Oral Q6H PRN Beau Fanny, MD   2 mg at 10/04/14 1022   Or  . haloperidol lactate (HALDOL) injection 2 mg  2 mg Intramuscular Q6H PRN Beau Fanny, MD      . lisinopril (PRINIVIL,ZESTRIL) tablet 5 mg  5 mg Oral Daily Audery Amel, MD   5 mg at 10/06/14 0947  . loratadine (CLARITIN) tablet 10 mg  10 mg Oral Daily Jimmy Footman, MD   10 mg at 10/06/14 0947  . LORazepam (ATIVAN) tablet 1 mg  1 mg Oral Q6H PRN Beau Fanny, MD   1 mg at 10/04/14 1021   Or  . LORazepam (ATIVAN) injection 1 mg  1 mg Intramuscular Q6H PRN Beau Fanny, MD      . magnesium hydroxide (MILK OF MAGNESIA) suspension 30 mL  30 mL Oral Daily PRN Audery Amel, MD   30 mL at 10/06/14 0026  . risperiDONE (RISPERDAL M-TABS) disintegrating tablet 5 mg  5 mg Oral QHS Beau Fanny, MD   5 mg at 10/06/14 2228    Lab Results: No results found for this or any previous visit (from the past 48 hour(s)).  Physical Findings: AIMS: Facial and Oral Movements Muscles of Facial Expression: None, normal Lips and Perioral Area: None, normal Jaw: None, normal Tongue: None, normal,Extremity Movements Upper (arms, wrists, hands, fingers): None, normal Lower (legs, knees, ankles, toes): None, normal, Trunk Movements Neck, shoulders, hips: None, normal, Overall  Severity Severity of abnormal movements (highest score from questions above): None, normal Incapacitation due to abnormal movements: None, normal Patient's awareness of abnormal movements (rate only patient's report): No Awareness, Dental Status Current problems with teeth and/or dentures?: No Does patient usually wear dentures?: No  CIWA:  CIWA-Ar Total: 0 COWS:     Treatment Plan Summary: Continue and increase dose of Risperidone to 6 mgs po hs to see if this would help with her  thoughts. Supportive counseling completed. Blood pressure remains under good control. In fact is rather low but she is not having any side effects or problems related to it.  Medical Decision Making:  Established Problem, Worsening (2), Review of Medication Regimen & Side Effects (2) and Review of New Medication or Change in Dosage (2)     Conrad Zajkowski K 10/07/2014, 12:52 PM

## 2014-10-07 NOTE — Progress Notes (Signed)
Patient is alert and aware she is in the hospital although she cannot explain why. Her mood is labile with congruent affect. Denies SI. She denies hallucinations but does express paranoid thinking with poor response to reality orientation. Speech is clear but thoughts are very disorganized, tangential, loose.  Plan - continue with current treatment plan. Monitor mood and mental status, response to medication. Reality orientation as tolerated. Maintain on 15 minute checks for safety.

## 2014-10-07 NOTE — Progress Notes (Signed)
Recreation Therapy Notes  Date: 07.25.16 Time: 3:00 pm Location: Craft Room  Group Topic: Self-expression  Goal Area(s) Addresses:  Patient will effectively use art as a means of self-expression. Patient will recognize positive benefit of self-expression. Patient will be able to identify one emotion experienced during group session. Patient will identify use of art/self-expression as a coping skill.  Behavioral Response: Disruptive  Intervention: Two Faces of Me  Activity: Patients were given a blank face worksheet and instructed to draw a line down the middle. On one side they were instructed to draw or write how they felt when they were admitted to the hospital. On the other side they were instructed to draw or write how they want to feel when they are d/c from the hospital.  Education: LRT educated patients on different forms of self-expression.  Education Outcome: Acknowledges education/In group clarification offered/Needs additional education.   Clinical Observations/Feedback: Patient did not participate in group activity. Patient did not contribute to group discussion. Patient was off topic for group. LRT had to redirect patient. Patient compliant.   Jacquelynn Cree, LRT/CTRS 10/07/2014 4:16 PM

## 2014-10-07 NOTE — Progress Notes (Signed)
Patient ID: Gloria Frank, female   DOB: 03-26-1970, 44 y.o.   MRN: [id number]  SW called to follow up with [facility], Spoke with admission staff who verbalizes Pt was on the list 10/01/2014 but then requested additional information on 2nd shift.  Staff says that this information was not received and she was taken off of list.  SW instructed to re-send referral and Pt will be added to list again.    SW followed up with Claudine Bandi, LCSW who initially made referral. She agreed to call [facility] and discuss referral and re send.

## 2014-10-07 NOTE — [facility] Group Notes (Signed)
[facility] Group Notes:  (Nursing/MHT/Case Management/Adjunct)  Date:  10/07/2014  Time:  12:38 PM  Type of Therapy:  Psychoeducational Skills  Participation Level:  Did Not Attend   Moris Ratchford Travis Voshon Petro 10/07/2014, 12:38 PM 

## 2014-10-08 NOTE — Progress Notes (Addendum)
[facility] LCSW Group Therapy  10/08/2014 3:41 PM  Type of Therapy:  Group Therapy  Participation Level:  Did Not Attend  Participation Quality:    Affect:    Cognitive:    Insight:    Engagement in Therapy:    Modes of Intervention:    Summary of Progress/Problems:  Cheron Schaumann 10/08/2014, 3:41 PM

## 2014-10-08 NOTE — Progress Notes (Signed)
Was pacing the hallways at onset of shift. Seems paranoid and has multiple requests for copies of treatment plans. Also requested to find out who would be paying for this hospital admission. Was unhappy with medication does at 2100 but was ultimately compliant. Did deny SI, HI, and AVH.

## 2014-10-08 NOTE — Progress Notes (Signed)
D: Patient stated slept fair last night .Stated appetite is poor and energy level  Is normal. Stated concentration is good . Stated on Depression scale 0 , hopeless 0 and anxiety 0  .( low 0-10 high) Denies suicidal  homicidal ideations  . Denies  auditory hallucinations  Although talking to self.  No pain concerns . Appropriate ADL'S. Limited  Interacting with peers and staff.  Thought process remain altered . Wanted insurance to pay for her hair  To be weaved . Limited insight into her issues. Constantly at nursing station. Refused am medications. A: Encourage patient participation with unit programming . Instruction  Given on  Medication , verbalize understanding. R: Voice no other concerns. Staff continue to monitor

## 2014-10-08 NOTE — Progress Notes (Signed)
Recreation Therapy Notes  Date: 07.26.16 Time: 3:00 pm Location: Craft Room  Group Topic: Coping Skills  Goal Area(s) Addresses:  Patient will verbalize an emotion experienced during group. Patient will verbalize benefit of using art as a coping skill.  Behavioral Response: Did not attend  Intervention: Coloring  Activity: Patients were given coloring sheets and instructed to color.   Education: LRT educated patients on benefit of using art as a Associate Professor.  Education Outcome: Patient did not attend group.   Clinical Observations/Feedback: Patient did not attend group.  Jacquelynn Cree, LRT/CTRS 10/08/2014 4:24 PM

## 2014-10-08 NOTE — [facility] Group Notes (Signed)
[facility] Group Notes:  (Nursing/MHT/Case Management/Adjunct)  Date:  10/08/2014  Time:  2:10 PM  Type of Therapy:  Psychoeducational Skills  Participation Level:  Did Not Attend     Gloria [name] A [name] 10/08/2014, 2:10 PM

## 2014-10-08 NOTE — Tx Team (Signed)
Interdisciplinary Treatment Plan Update (Adult)  Date:  10/08/2014 Time Reviewed:  5:46 PM  Progress in Treatment: Attending groups: [email or web address] Participating in groups:  Yes., disruptive due to disorganization Taking medication as prescribed:  Yes. Tolerating medication:  Yes. Family/Significant othe contact made:  Yes, individual(s) contacted:   son Patient understands diagnosis:  No.Lack of insight Discussing patient identified problems/goals with staff:  Yes. Medical problems stabilized or resolved:  Yes. Denies suicidal/homicidal ideation: Yes. Issues/concerns per patient self-inventory:  Yes. Other:  New problem(s) identified: No, Describe:     Discharge Plan or Barriers:  Reason for Continuation of Hospitalization: Delusions  Other; describe disorganization, inability to care for herself, tangential, suspicious/paranoid of others  Comments: Remains on [facility] waitlist  Estimated length of stay:7 days  New goal(s):  Review of initial/current patient goals per problem list:   See plan of Care  Attendees: Patient:  Gloria [name] 7/26/20165:46 PM  Family:   7/26/20165:46 PM  Physician:  Margarita Rana. MD 7/26/20165:46 PM  Nursing:   Shelia Media. RN 7/26/20165:46 PM  Case Manager:   7/26/20165:46 PM  Counselor:   7/26/20165:46 PM  Other:  Jake Shark, LCSW 7/26/20165:46 PM  Other:  Beryl Meager, LCSWA 7/26/20165:46 PM  Other:  Hershal Coria, LRT 7/26/20165:46 PM  Other:  7/26/20165:46 PM  Other:  7/26/20165:46 PM  Other:  7/26/20165:46 PM  Other:  7/26/20165:46 PM  Other:  7/26/20165:46 PM  Other:  7/26/20165:46 PM  Other:   7/26/20165:46 PM   Scribe for Treatment Team:   Glennon Mac, 10/08/2014, 5:46 PM

## 2014-10-08 NOTE — Progress Notes (Signed)
[facility] MD Progress Note  10/08/2014 2:33 PM Gloria [name]  MRN:  [id number] Subjective:  Follow-up for this 44 year old woman with schizophrenia as well as high blood pressure Pt seen in the Day room and" then in the haLL WAY.  pT REPORTS that all of the [pts were up all night as Staff gave them too much meds and they should not be working here. When questions are asked she answers and most of it disconnected and dis- jointed and does not make much sense. Staff reports that she has not been agitated and not been argumentative. Principal Problem: Schizophrenia, unspecified type Diagnosis:   Patient Active Problem List   Diagnosis Date Noted  . Schizophrenia, unspecified type [F20.9] 09/24/2014  . High blood pressure [I10] 09/23/2014   Total Time spent with patient: 30 minutes   Past Medical History: History reviewed. No pertinent past medical history. History reviewed. No pertinent past surgical history. Family History: History reviewed. No pertinent family history. Social History:  History  Alcohol Use No     History  Drug Use No    History   Social History  . Marital Status: Married    Spouse Name: N/A  . Number of Children: N/A  . Years of Education: N/A   Social History Main Topics  . Smoking status: Never Smoker   . Smokeless tobacco: Not on file  . Alcohol Use: No  . Drug Use: No  . Sexual Activity: Not on file   Other Topics Concern  . None   Social History Narrative   Additional History:   patient remains very psychotic. Once again today she asked me if she could please see a medical examiner or corner. Made several other bizarre comments. Very paranoid seeming. Took her quite a while to be convinced that I was actually the doctor on call today. Seems to be taking her Risperdal as far as we can tell no sign of side effects Sleep: Fair  Appetite:  Fair   Assessment: Patient appears to have schizophrenia with probably a history of an adequate treatment  with no insight. Still very psychotic. Not able to reach anyone else in the family who may help with discharge planning either  Musculoskeletal: Strength & Muscle Tone: within normal limits Gait & Station: normal Patient leans: N/A   Psychiatric Specialty Exam: Physical Exam  Nursing note and vitals reviewed. Constitutional: She appears well-developed and well-nourished.  HENT:  Head: Normocephalic and atraumatic.  Eyes: Conjunctivae are normal. Pupils are equal, round, and reactive to light.  Neck: Normal range of motion.  Cardiovascular: Normal heart sounds.   Respiratory: Effort normal.  GI: Soft.  Musculoskeletal: Normal range of motion.  Neurological: She is alert.  Skin: Skin is warm and dry.  Psychiatric: Her affect is angry and blunt. Her speech is delayed. She is withdrawn. Thought content is paranoid and delusional. Cognition and memory are impaired. She expresses inappropriate judgment.    Review of Systems  Constitutional: Negative.   HENT: Negative.   Eyes: Negative.   Respiratory: Negative.   Cardiovascular: Negative.   Gastrointestinal: Negative.   Musculoskeletal: Negative.   Skin: Negative.   Neurological: Negative.   Psychiatric/Behavioral: Negative for depression, suicidal ideas, hallucinations, memory loss and substance abuse. The patient is not nervous/anxious and does not have insomnia.     Blood pressure 115/80, pulse 84, temperature 98.4 F (36.9 C), temperature source Oral, resp. rate 20, height 5\' 4"  (1.626 m), weight 79.379 kg (175 lb), SpO2 100 %.Body mass index  is 30.02 kg/(m^2).  General Appearance: Bizarre  Eye Contact::  Fair  Speech:  Normal Rate  Volume:  Decreased  Mood:  Irritable  Affect:  Flat  Thought Process:  Disorganized  Orientation:  Full (Time, Place, and Person)  Thought Content:  Delusions  Suicidal Thoughts:  No  Homicidal Thoughts:  No  Memory:  Immediate;   Good Recent;   Fair Remote;   Poor  Judgement:  Impaired   Insight:  Lacking  Psychomotor Activity:  Decreased  Concentration:  Poor  Recall:  Poor  Fund of Knowledge:Poor  Language: Poor  Akathisia:  No  Handed:  Right  AIMS (if indicated):     Assets:  Communication Skills Physical Health  ADL's:  Intact  Cognition: WNL  Sleep:  Number of Hours: 5     Current Medications: Current Facility-Administered Medications  Medication Dose Route Frequency Provider Last Rate Last Dose  . acetaminophen (TYLENOL) tablet 650 mg  650 mg Oral Q6H PRN Audery Amel, MD   650 mg at 10/04/14 0127  . alum & mag hydroxide-simeth (MAALOX/MYLANTA) 200-200-20 MG/5ML suspension 30 mL  30 mL Oral Q4H PRN Audery Amel, MD      . haloperidol (HALDOL) tablet 2 mg  2 mg Oral Q6H PRN Beau Fanny, MD   2 mg at 10/04/14 1022   Or  . haloperidol lactate (HALDOL) injection 2 mg  2 mg Intramuscular Q6H PRN Beau Fanny, MD      . lisinopril (PRINIVIL,ZESTRIL) tablet 5 mg  5 mg Oral Daily Audery Amel, MD   5 mg at 10/06/14 0947  . loratadine (CLARITIN) tablet 10 mg  10 mg Oral Daily Jimmy Footman, MD   10 mg at 10/06/14 0947  . LORazepam (ATIVAN) tablet 1 mg  1 mg Oral Q6H PRN Beau Fanny, MD   1 mg at 10/04/14 1021   Or  . LORazepam (ATIVAN) injection 1 mg  1 mg Intramuscular Q6H PRN Beau Fanny, MD      . magnesium hydroxide (MILK OF MAGNESIA) suspension 30 mL  30 mL Oral Daily PRN Audery Amel, MD   30 mL at 10/06/14 0026  . risperiDONE (RISPERDAL M-TABS) disintegrating tablet 6 mg  6 mg Oral QHS Beau Fanny, MD   6 mg at 10/07/14 2226    Lab Results: No results found for this or any previous visit (from the past 48 hour(s)).  Physical Findings: AIMS: Facial and Oral Movements Muscles of Facial Expression: None, normal Lips and Perioral Area: None, normal Jaw: None, normal Tongue: None, normal,Extremity Movements Upper (arms, wrists, hands, fingers): None, normal Lower (legs, knees, ankles, toes): None, normal, Trunk  Movements Neck, shoulders, hips: None, normal, Overall Severity Severity of abnormal movements (highest score from questions above): None, normal Incapacitation due to abnormal movements: None, normal Patient's awareness of abnormal movements (rate only patient's report): No Awareness, Dental Status Current problems with teeth and/or dentures?: No Does patient usually wear dentures?: No  CIWA:  CIWA-Ar Total: 0 COWS:     Treatment Plan Summary: Continue and watch pt with the current dose of Risperidone and increase it on 10/09/2014 hs dose as pt refuses to take AM meds. Supportive counseling completed. Blood pressure remains under good control. In fact is rather low but she is not having any side effects or problems related to it.  Medical Decision Making:  Established Problem, Worsening (2), Review of Medication Regimen & Side Effects (2) and Review of New  Medication or Change in Dosage (2)     Mirranda Monrroy K 10/08/2014, 2:33 PM

## 2014-10-08 NOTE — [facility] Group Notes (Signed)
[facility] LCSW Aftercare Discharge Planning Group Note  10/08/2014 7:46 AM  Participation Quality:  DID not atttend  Affect:    Cognitive:    Insight:    Engagement in Group:    Modes of Intervention:    Summary of Progress/Problems:  Cheron Schaumann 10/08/2014, 7:46 AM

## 2014-10-08 NOTE — Plan of Care (Signed)
Problem: Alteration in thought process Goal: STG-Patient is able to identify plan for continuing care at ( Patient is able to identify continuing plan for care at discharge)  Outcome: Not Progressing Patient unable to identify any plan  For continue care

## 2014-10-09 NOTE — [facility] Group Notes (Signed)
[facility] LCSW Group Therapy  10/09/2014 4:14 PM  Type of Therapy:  Group Therapy  Participation Level:  Minimal  Participation Quality:  Attentive  Affect:  Flat  Cognitive:  Disorganized  Insight:  Limited  Engagement in Therapy:  Limited  Modes of Intervention:  Discussion, Education, Socialization and Support  Summary of Progress/Problems: Emotional Regulation: Patients will identify both negative and positive emotions. They will discuss emotions they have difficulty regulating and how they impact their lives. Patients will be asked to identify healthy coping skills to combat unhealthy reactions to negative emotions.   Gloria Frank attended group and stayed the entire time. She appeared to have difficulty staying on topic but was able to minimally discuss emotional regulation. She interrupted other group members but was easily redirectable. She was pleasant through out group.   [employer] MSW, LCSWA  10/09/2014, 4:14 PM

## 2014-10-09 NOTE — Progress Notes (Signed)
LCSW consulted with Barb at [facility] who stated patient was removed from wait list. LCSW resent all documentation including the face sheets ( July 20th/16) of faxed transmittal docs. Patient was re-instated on wait list. Other LCSW SL was asked to call to verify for herself patient re-instated on wait list. This LCSW was advised she did and patient has been verified on wait list.  Formal complaint left on Jody Webber voice mail [facility].

## 2014-10-09 NOTE — Progress Notes (Signed)
Recreation Therapy Notes  Date: 07.27.16 Time: 3:00 pm Location: Craft Room  Group Topic: Self-esteem  Goal Area(s) Addresses:  Patient will identify positive attributes about self. Patient will identify at least one coping skill.  Behavioral Response: Disruptive, Left early  Intervention: All About Me  Activity: Patients were instructed to make an "All About Me" pamphlet with their life's motto, positive traits, coping skills, and their healthy support system.  Education: LRT educated patients on ways to increase their self-esteem.   Education Outcome: Left before LRT educated patients.  Clinical Observations/Feedback: Patient was fixated on how LRT dressed. LRT attempted redirecting patient. Patient left group at approximately 3:10 pm cussing. Patient did not return to group.  Jacquelynn Cree, LRT/CTRS 10/09/2014 4:24 PM

## 2014-10-09 NOTE — Plan of Care (Signed)
Problem: Alteration in thought process Goal: LTG-Patient verbalizes understanding importance med regimen (Patient verbalizes understanding of importance of medication regimen and need to continue outpatient care.)  Outcome: Progressing  Patient is compliant with medication regimen.      

## 2014-10-09 NOTE — [facility] Group Notes (Signed)
[facility] Group Notes:  (Nursing/MHT/Case Management/Adjunct)  Date:  10/09/2014  Time:  12:40 PM  Type of Therapy:  Psychoeducational Skills  Participation Level:  Did Not Attend   Lynelle Smoke [facility] 10/09/2014, 12:40 PM

## 2014-10-09 NOTE — Progress Notes (Signed)
[facility] MD Progress Note  10/09/2014 2:31 PM Gloria Frank  MRN:  [id number] Subjective:  Follow-up for this 44 year old woman with schizophrenia as well as high blood pressure Pt seen in the Day room and" then in the haLL way. Pt is seen pacing up and down all the time and though she attends Groups she is rather distracted and is disrupitve. that all of the [pts were up all night as Staff gave them too much meds and they should not be working here. When questions are asked she answers and most of it disconnected and dis- jointed and does not make much sense. Very disorganized in her thoughts and her answers. Staff reports that she has not been agitated and not been argumentative. Principal Problem: Schizophrenia, unspecified type Diagnosis:   Patient Active Problem List   Diagnosis Date Noted  . Schizophrenia, unspecified type [F20.9] 09/24/2014  . High blood pressure [I10] 09/23/2014   Total Time spent with patient: 30 minutes   Past Medical History: History reviewed. No pertinent past medical history. History reviewed. No pertinent past surgical history. Family History: History reviewed. No pertinent family history. Social History:  History  Alcohol Use No     History  Drug Use No    History   Social History  . Marital Status: Married    Spouse Name: N/A  . Number of Children: N/A  . Years of Education: N/A   Social History Main Topics  . Smoking status: Never Smoker   . Smokeless tobacco: Not on file  . Alcohol Use: No  . Drug Use: No  . Sexual Activity: Not on file   Other Topics Concern  . None   Social History Narrative   Additional History:   patient remains very psychotic. Once again today she asked me if she could please see a medical examiner or corner. Made several other bizarre comments. Very paranoid seeming. Took her quite a while to be convinced that I was actually the doctor on call today. Seems to be taking her Risperdal as far as we can tell no sign  of side effects Sleep: Fair  Appetite:  Fair   Assessment: Patient appears to have schizophrenia with probably a history of an adequate treatment with no insight. Still very psychotic. Not able to reach anyone else in the family who may help with discharge planning either  Musculoskeletal: Strength & Muscle Tone: within normal limits Gait & Station: normal Patient leans: N/A   Psychiatric Specialty Exam: Physical Exam  Nursing note and vitals reviewed. Constitutional: She appears well-developed and well-nourished.  HENT:  Head: Normocephalic and atraumatic.  Eyes: Conjunctivae are normal. Pupils are equal, round, and reactive to light.  Neck: Normal range of motion.  Cardiovascular: Normal heart sounds.   Respiratory: Effort normal.  GI: Soft.  Musculoskeletal: Normal range of motion.  Neurological: She is alert.  Skin: Skin is warm and dry.  Psychiatric: Her affect is angry and blunt. Her speech is delayed. She is withdrawn. Thought content is paranoid and delusional. Cognition and memory are impaired. She expresses inappropriate judgment.    Review of Systems  Constitutional: Negative.   HENT: Negative.   Eyes: Negative.   Respiratory: Negative.   Cardiovascular: Negative.   Gastrointestinal: Negative.   Musculoskeletal: Negative.   Skin: Negative.   Neurological: Negative.   Psychiatric/Behavioral: Negative for depression, suicidal ideas, hallucinations, memory loss and substance abuse. The patient is not nervous/anxious and does not have insomnia.     Blood pressure 150/87, pulse  59, temperature 98.6 F (37 C), temperature source Oral, resp. rate 20, height 5\' 4"  (1.626 m), weight 79.379 kg (175 lb), SpO2 100 %.Body mass index is 30.02 kg/(m^2).  General Appearance: Bizarre  Eye Contact::  Fair  Speech:  Normal Rate  Volume:  Decreased  Mood:  Irritable  Affect:  Flat  Thought Process:  Disorganized  Orientation:  Full (Time, Place, and Person)  Thought  Content:  Delusions  Suicidal Thoughts:  No  Homicidal Thoughts:  No  Memory:  Immediate;   Good Recent;   Fair Remote;   Poor  Judgement:  Impaired  Insight:  Lacking  Psychomotor Activity:  Decreased  Concentration:  Poor  Recall:  Poor  Fund of Knowledge:Poor  Language: Poor  Akathisia:  No  Handed:  Right  AIMS (if indicated):     Assets:  Communication Skills Physical Health  ADL's:  Intact  Cognition: WNL  Sleep:  Number of Hours: 4.5     Current Medications: Current Facility-Administered Medications  Medication Dose Route Frequency Provider Last Rate Last Dose  . acetaminophen (TYLENOL) tablet 650 mg  650 mg Oral Q6H PRN Audery Amel, MD   650 mg at 10/04/14 0127  . alum & mag hydroxide-simeth (MAALOX/MYLANTA) 200-200-20 MG/5ML suspension 30 mL  30 mL Oral Q4H PRN Audery Amel, MD      . haloperidol (HALDOL) tablet 2 mg  2 mg Oral Q6H PRN Beau Fanny, MD   2 mg at 10/04/14 1022   Or  . haloperidol lactate (HALDOL) injection 2 mg  2 mg Intramuscular Q6H PRN Beau Fanny, MD      . lisinopril (PRINIVIL,ZESTRIL) tablet 5 mg  5 mg Oral Daily Audery Amel, MD   5 mg at 10/09/14 0903  . loratadine (CLARITIN) tablet 10 mg  10 mg Oral Daily Jimmy Footman, MD   10 mg at 10/09/14 0903  . LORazepam (ATIVAN) tablet 1 mg  1 mg Oral Q6H PRN Beau Fanny, MD   1 mg at 10/04/14 1021   Or  . LORazepam (ATIVAN) injection 1 mg  1 mg Intramuscular Q6H PRN Beau Fanny, MD      . magnesium hydroxide (MILK OF MAGNESIA) suspension 30 mL  30 mL Oral Daily PRN Audery Amel, MD   30 mL at 10/06/14 0026  . risperiDONE (RISPERDAL M-TABS) disintegrating tablet 6 mg  6 mg Oral QHS Beau Fanny, MD   6 mg at 10/08/14 2246    Lab Results: No results found for this or any previous visit (from the past 48 hour(s)).  Physical Findings: AIMS: Facial and Oral Movements Muscles of Facial Expression: None, normal Lips and Perioral Area: None, normal Jaw: None,  normal Tongue: None, normal,Extremity Movements Upper (arms, wrists, hands, fingers): None, normal Lower (legs, knees, ankles, toes): None, normal, Trunk Movements Neck, shoulders, hips: None, normal, Overall Severity Severity of abnormal movements (highest score from questions above): None, normal Incapacitation due to abnormal movements: None, normal Patient's awareness of abnormal movements (rate only patient's report): No Awareness, Dental Status Current problems with teeth and/or dentures?: No Does patient usually wear dentures?: No  CIWA:  CIWA-Ar Total: 0 COWS:     Treatment Plan Summary: Continue and watch pt with the current dose of Risperidone and increase it on 10/09/2014 hs dose as pt refuses to take AM meds. Supportive counseling completed. Blood pressure remains under good control. In fact is rather low but she is not having any side  effects or problems related to it. This appears to be rather baseline functioning of the pt and will re-assess for further meds and treatment.  Medical Decision Making:  Established Problem, Worsening (2), Review of Medication Regimen & Side Effects (2) and Review of New Medication or Change in Dosage (2)     Achol Azpeitia K 10/09/2014, 2:31 PM

## 2014-10-09 NOTE — Progress Notes (Signed)
D: Pt denies SI/HI/AVH. Pt is angry, argumentative and irritable, affect is flat and sad, frequent redirection needed for safety, pacing back and forth and noted responding to internal stimuli, she appears anxious and is not interacting appropriately with peers and staff.  A: Pt was offered support and encouragement. Pt was given scheduled medications. Pt was encouraged to attend groups. Q 15 minute checks were done for safety.  R:Pt did not attend group/courtyard activity, not interacting well with peers and staff. Pt is taking medication after much persuasion.  Pt is not  receptive to treatment, safety measures maintained on unit.

## 2014-10-09 NOTE — Progress Notes (Signed)
D: Patient remains disorganized and intrusive on unit .Demanding items from breakfast / lunch. Pacing hall through out shift . Questioning with no reason. No group participation  With unit programming .[employer] in site into behavior. A: Redirect   And orient . Encourage appropriate behavior  R: No response  Continue bizarre behavior .

## 2014-10-10 NOTE — Progress Notes (Signed)
D: Patient denies SI/HI/AVH, but noted to be responding to internal stimuli, affect is flat and sad but brightens upon approach. Patient paces back and forth the unit and approaches the staff with bizarre request at multiple times. Patient's thought process is disorganized, with loose association noted, she appears less anxious and is not interacting with peers and staff appropriately.  A: Patient was offered support and encouragement. Pt was given scheduled medications. Pt was encouraged to attend groups. Q 15 minute checks were done for safety.  R: Patient did not attend evening groups and is not interacting appropriately with peers and staff. Pt is compliant with  medication. Patient is  receptive to treatment and safety maintained on unit.

## 2014-10-10 NOTE — Plan of Care (Signed)
Problem: Alteration in thought process Goal: LTG-Patient has not harmed self or others in at least 2 days Outcome: Progressing Denies SI/HI, has not hurt self in 2 days, 15 minutes checks maintained.  Goal: STG-Patient is able to follow short directions Outcome: Progressing Patient is able to follow simple commands, frequent redirection to maintain safety.

## 2014-10-10 NOTE — Plan of Care (Signed)
Problem: Alteration in thought process Goal: LTG-Patient is able to perceive the environment accurately Outcome: Not Progressing Remains bizarre with comments and environment.

## 2014-10-10 NOTE — [facility] Group Notes (Signed)
[facility] Group Notes:  (Nursing/MHT/Case Management/Adjunct)  Date:  10/10/2014  Time:  2:16 PM  Type of Therapy:  Psychoeducational Skills  Participation Level:  Did Not Attend    Gloria Frank 10/10/2014, 2:16 PM

## 2014-10-10 NOTE — Progress Notes (Signed)
D: Refused  Medication  This am shift . Patient came later and took her blood pressure medications. Patient stripped her bed linens this am . Patient remained disorganized , intrusive . Thought process   Remains altered . Question  Asked have no reason to them . Patient continues to walk around unit with a pair of underwear on her head .  No participation with unit programming . Attempting to interact ,with her peers  Appetite good  Unable to think to write what she wants at meal time .  A:Redirected  To situations on the unit. Given  Time to attempt to process information. R: Patient receptive to information received

## 2014-10-10 NOTE — [facility] Group Notes (Signed)
[facility] LCSW Aftercare Discharge Planning Group Note  10/10/2014 8:30 AM  Participation Quality:    Affect:  DID not attend  Cognitive:    Insight:    Engagement in Group:    Modes of Intervention:    Summary of Progress/Problems: Came in briefly and her plan is to be discharged immediatly she walked out after 2 minutes.  [name], Gloria Frank 10/10/2014, 8:30 AM

## 2014-10-10 NOTE — Progress Notes (Signed)
Recreation Therapy Notes  Date: 07.28.16 Time: 3:00 pm Location: Craft Room  Group Topic: Coping Skills, Leisure Education  Goal Area(s) Addresses:  Patient will identify things they are grateful for. Patient will identify how being grateful can influence decision making.  Behavioral Response: Did not attend  Intervention: Grateful Wheel  Activity: Patients were given an "I Am Grateful For" worksheet and instructed to list at least one thing they are grateful for under each category.   Education: LRT educated patients on why it is important to be grateful.  Education Outcome: Patient did not attend group.   Clinical Observations/Feedback: Patient did not attend group.  Jacquelynn Cree, LRT/CTRS 10/10/2014 4:12 PM

## 2014-10-10 NOTE — [facility] Group Notes (Signed)
[facility] Group Notes:  (Nursing/MHT/Case Management/Adjunct)  Date:  10/10/2014  Time:  1:48 AM  Type of Therapy:  Psychoeducational Skills  Participation Level:  Did Not Attend   Foy Guadalajara 10/10/2014, 1:48 AM

## 2014-10-10 NOTE — Progress Notes (Signed)
[facility] MD Progress Note  10/10/2014 1:41 PM Gloria Frank  MRN:  [id number] Subjective:  Follow-up for this 44 year old woman with schizophrenia as well as high blood pressure Pt seen in her room then in the hall way. Pt is seen pacing up and down all the time and though she attends Groups she is rather distracted and is disrupitve. At times. Today she reports that she is sleeping until 11.00am and she is used to wake up at 5.00am. P t was informed that she should go to bed early she reports that she is from [place] and so she cannot do that.. When questions are asked she answers and most of it disconnected and dis- jointed and does not make much sense. Very disorganized in her thoughts and her answers. Staff reports that she has not been agitated and not been argumentative. This appears to be her baseline functioning and discussed in TT about discharge if she continues to be the same. Principal Problem: Schizophrenia, unspecified type Diagnosis:   Patient Active Problem List   Diagnosis Date Noted  . Schizophrenia, unspecified type [F20.9] 09/24/2014  . High blood pressure [I10] 09/23/2014   Total Time spent with patient: 30 minutes   Past Medical History: History reviewed. No pertinent past medical history. History reviewed. No pertinent past surgical history. Family History: History reviewed. No pertinent family history. Social History:  History  Alcohol Use No     History  Drug Use No    History   Social History  . Marital Status: Married    Spouse Name: N/A  . Number of Children: N/A  . Years of Education: N/A   Social History Main Topics  . Smoking status: Never Smoker   . Smokeless tobacco: Not on file  . Alcohol Use: No  . Drug Use: No  . Sexual Activity: Not on file   Other Topics Concern  . None   Social History Narrative   Additional History:   patient remains very psychotic. Once again today she asked me if she could please see a medical examiner or  corner. Made several other bizarre comments. Very paranoid seeming. Took her quite a while to be convinced that I was actually the doctor on call today. Seems to be taking her Risperdal as far as we can tell no sign of side effects Sleep: Fair  Appetite:  Fair   Assessment: Patient appears to have schizophrenia with probably a history of an adequate treatment with no insight. Still very psychotic. Not able to reach anyone else in the family who may help with discharge planning either  Musculoskeletal: Strength & Muscle Tone: within normal limits Gait & Station: normal Patient leans: N/A   Psychiatric Specialty Exam: Physical Exam  Nursing note and vitals reviewed. Constitutional: She appears well-developed and well-nourished.  HENT:  Head: Normocephalic and atraumatic.  Eyes: Conjunctivae are normal. Pupils are equal, round, and reactive to light.  Neck: Normal range of motion.  Cardiovascular: Normal heart sounds.   Respiratory: Effort normal.  GI: Soft.  Musculoskeletal: Normal range of motion.  Neurological: She is alert.  Skin: Skin is warm and dry.  Psychiatric: Her affect is angry and blunt. Her speech is delayed. She is withdrawn. Thought content is paranoid and delusional. Cognition and memory are impaired. She expresses inappropriate judgment.    Review of Systems  Constitutional: Negative.   HENT: Negative.   Eyes: Negative.   Respiratory: Negative.   Cardiovascular: Negative.   Gastrointestinal: Negative.   Musculoskeletal: Negative.  Skin: Negative.   Neurological: Negative.   Psychiatric/Behavioral: Negative for depression, suicidal ideas, hallucinations, memory loss and substance abuse. The patient is not nervous/anxious and does not have insomnia.     Blood pressure 150/87, pulse 59, temperature 98.6 F (37 C), temperature source Oral, resp. rate 20, height 5\' 4"  (1.626 m), weight 79.379 kg (175 lb), SpO2 100 %.Body mass index is 30.02 kg/(m^2).  General  Appearance: Bizarre  Eye Contact::  Fair  Speech:  Normal Rate  Volume:  Decreased  Mood:  Irritable  Affect:  Flat  Thought Process:  Disorganized  Orientation:  Full (Time, Place, and Person)  Thought Content:  Delusions  Suicidal Thoughts:  No  Homicidal Thoughts:  No  Memory:  Immediate;   Good Recent;   Fair Remote;   Poor  Judgement:  Impaired  Insight:  Lacking  Psychomotor Activity:  Decreased  Concentration:  Poor  Recall:  Poor  Fund of Knowledge:Poor  Language: Poor  Akathisia:  No  Handed:  Right  AIMS (if indicated):     Assets:  Communication Skills Physical Health  ADL's:  Intact  Cognition: WNL  Sleep:  Number of Hours: 6.25     Current Medications: Current Facility-Administered Medications  Medication Dose Route Frequency Provider Last Rate Last Dose  . acetaminophen (TYLENOL) tablet 650 mg  650 mg Oral Q6H PRN Audery Amel, MD   650 mg at 10/04/14 0127  . alum & mag hydroxide-simeth (MAALOX/MYLANTA) 200-200-20 MG/5ML suspension 30 mL  30 mL Oral Q4H PRN Audery Amel, MD      . haloperidol (HALDOL) tablet 2 mg  2 mg Oral Q6H PRN Beau Fanny, MD   2 mg at 10/04/14 1022   Or  . haloperidol lactate (HALDOL) injection 2 mg  2 mg Intramuscular Q6H PRN Beau Fanny, MD      . lisinopril (PRINIVIL,ZESTRIL) tablet 5 mg  5 mg Oral Daily Audery Amel, MD   5 mg at 10/09/14 0903  . loratadine (CLARITIN) tablet 10 mg  10 mg Oral Daily Jimmy Footman, MD   10 mg at 10/09/14 0903  . LORazepam (ATIVAN) tablet 1 mg  1 mg Oral Q6H PRN Beau Fanny, MD   1 mg at 10/04/14 1021   Or  . LORazepam (ATIVAN) injection 1 mg  1 mg Intramuscular Q6H PRN Beau Fanny, MD      . magnesium hydroxide (MILK OF MAGNESIA) suspension 30 mL  30 mL Oral Daily PRN Audery Amel, MD   30 mL at 10/06/14 0026  . risperiDONE (RISPERDAL M-TABS) disintegrating tablet 6 mg  6 mg Oral QHS Beau Fanny, MD   6 mg at 10/09/14 2100    Lab Results: No results found for  this or any previous visit (from the past 48 hour(s)).  Physical Findings: AIMS: Facial and Oral Movements Muscles of Facial Expression: None, normal Lips and Perioral Area: None, normal Jaw: None, normal Tongue: None, normal,Extremity Movements Upper (arms, wrists, hands, fingers): None, normal Lower (legs, knees, ankles, toes): None, normal, Trunk Movements Neck, shoulders, hips: None, normal, Overall Severity Severity of abnormal movements (highest score from questions above): None, normal Incapacitation due to abnormal movements: None, normal Patient's awareness of abnormal movements (rate only patient's report): No Awareness, Dental Status Current problems with teeth and/or dentures?: No Does patient usually wear dentures?: No  CIWA:  CIWA-Ar Total: 0 COWS:     Treatment Plan Summary: Continue and watch pt with the current  dose of Risperidone and increase it on 10/09/2014 hs dose as pt refuses to take AM meds. Supportive counseling completed. Blood pressure remains under good control. In fact is rather low but she is not having any side effects or problems related to it. This appears to be rather baseline functioning of the pt and will re-assess for further meds and treatment.  Medical Decision Making:  Established problems stable with baseline functioning and discussed about considering for placement but waiting for [facility].     Margarita Rana K 10/10/2014, 1:41 PM

## 2014-10-10 NOTE — Tx Team (Signed)
Interdisciplinary Treatment Plan Update (Adult)  Date:  10/10/2014 Time Reviewed:  5:10 PM  Progress in Treatment: Attending groups: Yes. Participating in groups:  Yes. Taking medication as prescribed:  Yes. Tolerating medication:  Yes. Family/Significant othe contact made:  No, not yet  Patient understands diagnosis:  No. and As evidenced by:  Limited insight  Discussing patient identified problems/goals with staff:  Yes. Medical problems stabilized or resolved:  Yes. Denies suicidal/homicidal ideation: Yes. Issues/concerns per patient self-inventory:  No. Other:  New problem(s) identified: NA  Discharge Plan or Barriers: Pt is currently on the [facility] waitlist.   Reason for Continuation of Hospitalization: Delusions  Medication stabilization  Comments:Follow-up for this 44 year old woman with schizophrenia as well as high blood pressure Pt seen in her room then in the hall way. Pt is seen pacing up and down all the time and though she attends Groups she is rather distracted and is disrupitve. At times. Today she reports that she is sleeping until 11.00am and she is used to wake up at 5.00am. P t was informed that she should go to bed early she reports that she is from [place] and so she cannot do that.. When questions are asked she answers and most of it disconnected and dis- jointed and does not make much sense. Very disorganized in her thoughts and her answers. Staff reports that she has not been agitated and not been argumentative. This appears to be her baseline functioning and discussed in TT about discharge if she continues to be the same.  Estimated length of stay: 5-7 days   New goal(s): NA  Review of initial/current patient goals per problem list:    Attendees: Patient:  Gloria Frank 7/28/20165:10 PM  Family:   7/28/20165:10 PM  Physician:  Dr. Guss Bunde  7/28/20165:10 PM  Nursing:   Dedra Skeens, RN  7/28/20165:10 PM  Clinical Social Worker: [place], [place]  7/28/20165:10 PM  Counselor:   7/28/20165:10 PM  Other:  Hershal Coria, LRT 7/28/20165:10 PM  Other:   7/28/20165:10 PM  Other:   7/28/20165:10 PM  Other:  7/28/20165:10 PM  Other:  7/28/20165:10 PM  Other:  7/28/20165:10 PM  Other:  7/28/20165:10 PM  Other:  7/28/20165:10 PM  Other:  7/28/20165:10 PM  Other:   7/28/20165:10 PM   Scribe for Treatment Team:   Rondall Allegra, 10/10/2014, 5:10 PM

## 2014-10-11 MED ORDER — RISPERIDONE 1 MG PO TBDP
10.0000 mg | ORAL_TABLET | Freq: Every day | ORAL | Status: DC
  Administered 2014-10-11 – 2014-10-13 (×3): 10 mg via ORAL
  Filled 2014-10-11 (×3): qty 10

## 2014-10-11 NOTE — [facility] Group Notes (Signed)
[facility] Group Notes:  (Nursing/MHT/Case Management/Adjunct)  Date:  10/11/2014  Time:  12:34 PM  Type of Therapy:  Group Therapy  Participation Level:  None  Participation Quality:  Inattentive and Monopolizing  Affect:  Irritable  Cognitive:  Confused  Insight:  Lacking  Engagement in Group:  Lacking  Modes of Intervention:  Discussion  Summary of Progress/Problems:  Gloria Frank De'Chelle Ether Goebel 10/11/2014, 12:34 PM

## 2014-10-11 NOTE — Progress Notes (Signed)
   10/11/14 0900  Psychosocial Assessment  Patient Complaints Anxiety;Irritability  Eye Contact Avoids;Staring;Suspiciousness;Watchful  Facial Expression Anxious  Affect Inconsistent with thought content;Labile  Speech Tangential;Argumentative  Interaction Guarded  Motor Activity Restless  Appearance/Hygiene Disheveled;In scrubs  Behavior Characteristics Cooperative;Agressive verbally  Mood Labile;Suspicious;Irritable  Thought Process  Coherency Loose associations;Flight of ideas;Circumstantial  Content Paranoia  Delusions Paranoid  Perception Derealization  Hallucination None reported or observed  Judgment Poor  Confusion Mild   Patient alert and pacing the floors. Patient is suspicious about assessment questions and medications. Patient will approach you to inquire about different things but the conversation flows into flight of ideas. Patient is obsessed with [place]. Patient denies Si/Hi and auditory or visual hallucinations. Will continue to monitor.

## 2014-10-11 NOTE — [facility] Group Notes (Signed)
[facility] LCSW Aftercare Discharge Planning Group Note  10/11/2014 1:47 PM  Participation Quality: Patient did not attend group  Blessings Inglett T, MSW, LCSWA 10/11/2014, 1:47 PM 

## 2014-10-11 NOTE — Progress Notes (Signed)
[facility] MD Progress Note  10/11/2014 2:40 PM Gloria Frank  MRN:  [id number] Subjective:  Follow-up for this 44 year old AA  woman with schizophrenia as well as high blood pressure Pt seen in the hall way. She is seen pacing in the hall way most of the time.  Pt is seen pacing up and down all the time and though she attends Groups she is rather distracted and is hard to be redirected. When questions are asked she answers and most of it disconnected and dis- jointed and does not make much sense. Very disorganized in her thoughts and her answers. Staff reports that she has not been agitated and not been argumentative and is calmer and complaint today. Principal Problem: Schizophrenia, unspecified type Diagnosis:   Patient Active Problem List   Diagnosis Date Noted  . Schizophrenia, unspecified type [F20.9] 09/24/2014  . High blood pressure [I10] 09/23/2014   Total Time spent with patient: 30 minutes   Past Medical History: History reviewed. No pertinent past medical history. History reviewed. No pertinent past surgical history. Family History: History reviewed. No pertinent family history. Social History:  History  Alcohol Use No     History  Drug Use No    History   Social History  . Marital Status: Married    Spouse Name: N/A  . Number of Children: N/A  . Years of Education: N/A   Social History Main Topics  . Smoking status: Never Smoker   . Smokeless tobacco: Not on file  . Alcohol Use: No  . Drug Use: No  . Sexual Activity: Not on file   Other Topics Concern  . None   Social History Narrative   Additional History:   patient remains very psychotic. Once again today she asked me if she could please see a medical examiner or corner. Made several other bizarre comments. Very paranoid seeming. Took her quite a while to be convinced that I was actually the doctor on call today. Seems to be taking her Risperdal as far as we can tell no sign of side effects Sleep:  Fair  Appetite:  Fair   Assessment: Patient appears to have schizophrenia with probably a history of an adequate treatment with no insight. Still very psychotic. Not able to reach anyone else in the family who may help with discharge planning either  Musculoskeletal: Strength & Muscle Tone: within normal limits Gait & Station: normal Patient leans: N/A   Psychiatric Specialty Exam: Physical Exam  Nursing note and vitals reviewed. Constitutional: She appears well-developed and well-nourished.  HENT:  Head: Normocephalic and atraumatic.  Eyes: Conjunctivae are normal. Pupils are equal, round, and reactive to light.  Neck: Normal range of motion.  Cardiovascular: Normal heart sounds.   Respiratory: Effort normal.  GI: Soft.  Musculoskeletal: Normal range of motion.  Neurological: She is alert.  Skin: Skin is warm and dry.  Psychiatric: Her affect is angry and blunt. Her speech is delayed. She is withdrawn. Thought content is paranoid and delusional. Cognition and memory are impaired. She expresses inappropriate judgment.    Review of Systems  Constitutional: Negative.   HENT: Negative.   Eyes: Negative.   Respiratory: Negative.   Cardiovascular: Negative.   Gastrointestinal: Negative.   Musculoskeletal: Negative.   Skin: Negative.   Neurological: Negative.   Psychiatric/Behavioral: Negative for depression, suicidal ideas, hallucinations, memory loss and substance abuse. The patient is not nervous/anxious and does not have insomnia.     Blood pressure 114/77, pulse 88, temperature 97.7 F (36.5  C), temperature source Oral, resp. rate 20, height 5\' 4"  (1.626 m), weight 79.379 kg (175 lb), SpO2 100 %.Body mass index is 30.02 kg/(m^2).  General Appearance: Bizarre  Eye Contact::  Fair  Speech:  Normal Rate  Volume:  Decreased  Mood:  Irritable  Affect:  Flat  Thought Process:  Disorganized  Orientation:  Full (Time, Place, and Person)  Thought Content:  Delusions   Suicidal Thoughts:  No  Homicidal Thoughts:  No  Memory:  Immediate;   Good Recent;   Fair Remote;   Poor  Judgement:  Impaired  Insight:  Lacking  Psychomotor Activity:  Decreased  Concentration:  Poor  Recall:  Poor  Fund of Knowledge:Poor  Language: Poor  Akathisia:  No  Handed:  Right  AIMS (if indicated):     Assets:  Communication Skills Physical Health  ADL's:  Intact  Cognition: WNL  Sleep:  Number of Hours: 4.5     Current Medications: Current Facility-Administered Medications  Medication Dose Route Frequency Provider Last Rate Last Dose  . acetaminophen (TYLENOL) tablet 650 mg  650 mg Oral Q6H PRN Audery Amel, MD   650 mg at 10/04/14 0127  . alum & mag hydroxide-simeth (MAALOX/MYLANTA) 200-200-20 MG/5ML suspension 30 mL  30 mL Oral Q4H PRN Audery Amel, MD   30 mL at 10/10/14 1806  . haloperidol (HALDOL) tablet 2 mg  2 mg Oral Q6H PRN Beau Fanny, MD   2 mg at 10/04/14 1022   Or  . haloperidol lactate (HALDOL) injection 2 mg  2 mg Intramuscular Q6H PRN Beau Fanny, MD      . lisinopril (PRINIVIL,ZESTRIL) tablet 5 mg  5 mg Oral Daily Audery Amel, MD   5 mg at 10/11/14 0919  . loratadine (CLARITIN) tablet 10 mg  10 mg Oral Daily Jimmy Footman, MD   10 mg at 10/11/14 0919  . LORazepam (ATIVAN) tablet 1 mg  1 mg Oral Q6H PRN Beau Fanny, MD   1 mg at 10/04/14 1021   Or  . LORazepam (ATIVAN) injection 1 mg  1 mg Intramuscular Q6H PRN Beau Fanny, MD      . magnesium hydroxide (MILK OF MAGNESIA) suspension 30 mL  30 mL Oral Daily PRN Audery Amel, MD   30 mL at 10/06/14 0026  . risperiDONE (RISPERDAL M-TABS) disintegrating tablet 6 mg  6 mg Oral QHS Beau Fanny, MD   6 mg at 10/10/14 2140    Lab Results: No results found for this or any previous visit (from the past 48 hour(s)).  Physical Findings: AIMS: Facial and Oral Movements Muscles of Facial Expression: None, normal Lips and Perioral Area: None, normal Jaw: None,  normal Tongue: None, normal,Extremity Movements Upper (arms, wrists, hands, fingers): None, normal Lower (legs, knees, ankles, toes): None, normal, Trunk Movements Neck, shoulders, hips: None, normal, Overall Severity Severity of abnormal movements (highest score from questions above): None, normal Incapacitation due to abnormal movements: None, normal Patient's awareness of abnormal movements (rate only patient's report): No Awareness, Dental Status Current problems with teeth and/or dentures?: No Does patient usually wear dentures?: No  CIWA:  CIWA-Ar Total: 0 COWS:     Treatment Plan Summary: Continue and watch pt with the current dose of Risperidone and increase it on 10/09/2014 hs dose as pt refuses to take AM meds and pt refuses to take meds in AM  Will consider increasing her meds at hs. Supportive counseling completed. Blood pressure remains under  good control. In fact is rather low but she is not having any side effects or problems related to it. This appears to be rather baseline functioning of the pt and will re-assess for further meds and treatment.  Medical Decision Making:  Established Problem, Worsening (2), Review of Medication Regimen & Side Effects (2) and Review of New Medication or Change in Dosage (2)     Ricki Clack K 10/11/2014, 2:40 PM

## 2014-10-11 NOTE — Progress Notes (Signed)
Recreation Therapy Notes  Date: 07.29.16 Time: 3:20 pm Location: Craft Room  Group Topic: Coping Skills  Goal Area(s) Addresses:  Patients will verbalize emotion experienced while coloring. Patients will verbalize benefit of coloring.  Behavioral Response: Did not attend  Intervention: [place]  Activity: Patients were given a mandala and instructed to color.  Education: LRT educated patients on benefits of using art as a Associate Professor  Education Outcome: Patient did not attend group.  Clinical Observations/Feedback: Patient did not attend group.  Jacquelynn Cree, LRT/CTRS 10/11/2014 4:06 PM

## 2014-10-11 NOTE — Progress Notes (Signed)
.  Patient slept for 5.5 hours

## 2014-10-11 NOTE — Plan of Care (Signed)
Problem: Ineffective individual coping Goal: STG: Patient will remain free from self harm Outcome: Progressing Patient has remained free from harm, 15 minute checks for safety maintained, denied pain, will monitor for safety.

## 2014-10-11 NOTE — Progress Notes (Signed)
Mostly visible in the milieu pacing at start of the shift, less anxious, requested to talk x3, conversation irrelevant, FOI, loose association (tangential type), nonsensical and perseverating in nature. Patient was allowed to vent: she talked about her 3 adult children and 2 grandchildren. Then she talked about "some stuffs on TV happening in [place]....and that's why I am here" Patient denied pain, hesitant to take medication (risperidone 6 mg at bedtime) because she does not want to sleep all night. Came out of her room because of a spider crawling about, Clinical research associate and another female nursing staffs got rid of the spider, encouraged patient to go back to sleep.

## 2014-10-11 NOTE — Progress Notes (Addendum)
Patient alert. Questioning medication. Patient blurting out questions  and suspicious of answers. No complaints of pain. Will continue to monitor.

## 2014-10-12 NOTE — [facility] Group Notes (Signed)
[facility] LCSW Group Therapy  10/12/2014 10:34 AM (Late entry for 10/11/2014)   Type of Therapy:  Group Therapy  Participation Level:  Minimal  Participation Quality:  Intrusive  Affect:  Flat  Cognitive:  Disorganized  Insight:  Limited  Engagement in Therapy:  Limited  Modes of Intervention:  Discussion, Education, Socialization and Support  Summary of Progress/Problems: Feelings around Relapse. Group members discussed the meaning of relapse and shared personal stories of relapse, how it affected them and others, and how they perceived themselves during this time. Group members were encouraged to identify triggers, warning signs and coping skills used when facing the possibility of relapse. Social supports were discussed and explored in detail. Gloria Frank attended group briefly. She had difficulties staying on topic. She required redirection many times but eventually left group.   [name] L Dagan Heinz MSW, LCSWA  10/12/2014, 10:34 AM

## 2014-10-12 NOTE — Progress Notes (Signed)
[facility] MD Progress Note  10/12/2014 1:44 PM Gloria Frank  MRN:  [id number] Subjective:  Follow-up for this 44 year old AA  woman with schizophrenia as well as high blood pressure. Patient was fixated on discussing loose connections between employment with the government (i.e. [employer]), an uncle with autism and then bias against her if she wanted to pursue a position with the [employer]. She then employed the issue of bias against her in pursuit of that position because she has an uncle who she reports has autism. She then discussed a son with autism. When I discussed with her any issues with her medication she then began to relate some issues with stress secondary to her son's father. I was able to get her to answer whether she had any physical problems at this time and she was able to clearly state she did not. I asked her if she had a positive other medications and she then discussed the story related to her son's father. Principal Problem: Schizophrenia, unspecified type Diagnosis:   Patient Active Problem List   Diagnosis Date Noted  . Schizophrenia, unspecified type [F20.9] 09/24/2014  . High blood pressure [I10] 09/23/2014   Total Time spent with patient: 30 minutes   Past Medical History: History reviewed. No pertinent past medical history. History reviewed. No pertinent past surgical history. Family History: History reviewed. No pertinent family history. Social History:  History  Alcohol Use No     History  Drug Use No    History   Social History  . Marital Status: Married    Spouse Name: N/A  . Number of Children: N/A  . Years of Education: N/A   Social History Main Topics  . Smoking status: Never Smoker   . Smokeless tobacco: Not on file  . Alcohol Use: No  . Drug Use: No  . Sexual Activity: Not on file   Other Topics Concern  . None   Social History Narrative   Additional History:   patient remains very psychotic. Does appear to want to engage although is very loose  in her thought process. Sleep: Fair  Appetite:  Fair   Assessment: Patient appears to have schizophrenia with probably a history of an adequate treatment with no insight. Still very psychotic. Not able to reach anyone else in the family who may help with discharge planning either  Musculoskeletal: Strength & Muscle Tone: within normal limits Gait & Station: normal Patient leans: N/A   Psychiatric Specialty Exam: Physical Exam  Nursing note and vitals reviewed. Constitutional: She appears well-developed and well-nourished.  HENT:  Head: Normocephalic and atraumatic.  Eyes: Conjunctivae are normal. Pupils are equal, round, and reactive to light.  Neck: Normal range of motion.  Cardiovascular: Normal heart sounds.   Respiratory: Effort normal.  GI: Soft.  Musculoskeletal: Normal range of motion.  Neurological: She is alert.  Skin: Skin is warm and dry.  Psychiatric: Her affect is angry and blunt. Her speech is delayed. She is withdrawn. Thought content is paranoid and delusional. Cognition and memory are impaired. She expresses inappropriate judgment.    Review of Systems  Constitutional: Negative.   HENT: Negative.   Eyes: Negative.   Respiratory: Negative.   Cardiovascular: Negative.   Gastrointestinal: Negative.   Musculoskeletal: Negative.   Skin: Negative.   Neurological: Negative.   Psychiatric/Behavioral: Negative for depression, suicidal ideas, hallucinations, memory loss and substance abuse. The patient is not nervous/anxious and does not have insomnia.     Blood pressure 114/77, pulse 88, temperature 97.7  F (36.5 C), temperature source Oral, resp. rate 20, height 5\' 4"  (1.626 m), weight 79.379 kg (175 lb), SpO2 100 %.Body mass index is 30.02 kg/(m^2).  General Appearance: Bizarre  Eye Contact::  Fair  Speech:  Normal Rate  Volume:  Decreased  Mood:  Okay  Affect:  Flat  Thought Process:  Disorganized  Orientation:  Full (Time, Place, and Person)  Thought  Content:  Delusions  Suicidal Thoughts:  No  Homicidal Thoughts:  No  Memory:  Immediate;   Good Recent;   Fair Remote;   Poor  Judgement:  Impaired  Insight:  Lacking  Psychomotor Activity:  Decreased  Concentration:  Poor  Recall:  Poor  Fund of Knowledge:Poor  Language: Poor  Akathisia:  No  Handed:  Right  AIMS (if indicated):     Assets:  Communication Skills Physical Health  ADL's:  Intact  Cognition: WNL  Sleep:  Number of Hours: 4.25     Current Medications: Current Facility-Administered Medications  Medication Dose Route Frequency Provider Last Rate Last Dose  . acetaminophen (TYLENOL) tablet 650 mg  650 mg Oral Q6H PRN Audery Amel, MD   650 mg at 10/04/14 0127  . alum & mag hydroxide-simeth (MAALOX/MYLANTA) 200-200-20 MG/5ML suspension 30 mL  30 mL Oral Q4H PRN Audery Amel, MD   30 mL at 10/10/14 1806  . haloperidol (HALDOL) tablet 2 mg  2 mg Oral Q6H PRN Beau Fanny, MD   2 mg at 10/04/14 1022   Or  . haloperidol lactate (HALDOL) injection 2 mg  2 mg Intramuscular Q6H PRN Beau Fanny, MD      . lisinopril (PRINIVIL,ZESTRIL) tablet 5 mg  5 mg Oral Daily Audery Amel, MD   5 mg at 10/12/14 1312  . loratadine (CLARITIN) tablet 10 mg  10 mg Oral Daily Jimmy Footman, MD   10 mg at 10/12/14 1312  . LORazepam (ATIVAN) tablet 1 mg  1 mg Oral Q6H PRN Beau Fanny, MD   1 mg at 10/04/14 1021   Or  . LORazepam (ATIVAN) injection 1 mg  1 mg Intramuscular Q6H PRN Beau Fanny, MD      . magnesium hydroxide (MILK OF MAGNESIA) suspension 30 mL  30 mL Oral Daily PRN Audery Amel, MD   30 mL at 10/06/14 0026  . risperiDONE (RISPERDAL M-TABS) disintegrating tablet 10 mg  10 mg Oral QHS Beau Fanny, MD   10 mg at 10/11/14 2230    Lab Results: No results found for this or any previous visit (from the past 48 hour(s)).  Physical Findings: AIMS: Facial and Oral Movements Muscles of Facial Expression: None, normal Lips and Perioral Area: None,  normal Jaw: None, normal Tongue: None, normal,Extremity Movements Upper (arms, wrists, hands, fingers): None, normal Lower (legs, knees, ankles, toes): None, normal, Trunk Movements Neck, shoulders, hips: None, normal, Overall Severity Severity of abnormal movements (highest score from questions above): None, normal Incapacitation due to abnormal movements: None, normal Patient's awareness of abnormal movements (rate only patient's report): No Awareness, Dental Status Current problems with teeth and/or dentures?: No Does patient usually wear dentures?: No  CIWA:  CIWA-Ar Total: 0 COWS:     Treatment Plan Summary: Psychosis. Given review of past notes it does appear that patient's irritability may have improved slightly and her paranoia he be decreasing. Continue and watch pt with the current dose of Risperidone M tab 10 mg Qhs.  Hypertension-blood pressure within normal limits. Continue lisinopril.  Irritability-when necessary use of lorazepam and Haldol.  Medical Decision Making:  Established Problem, Worsening (2), Review of Medication Regimen & Side Effects (2) and Review of New Medication or Change in Dosage (2)     Wallace Going 10/12/2014, 1:44 PM

## 2014-10-12 NOTE — Plan of Care (Signed)
Problem: Consults Goal: Psychosis Patient Education See Patient Education Module for education specifics.  Outcome: Not Met (add Reason) Pt has no insight and rejects the idea that she is in any way delusional and/or psychotic.

## 2014-10-12 NOTE — Progress Notes (Signed)
D: Pt is awake and active in the milieu this evening. Pt mood is irritable and her affect is blunted. Pt continues to be manipulative and controlling with staff. Pt conversation is tangential and bizzarre.  A: Writer provided emotional support and administered medications as prescribed. Writer also provided printed medication materials per pt request, and provided frequent redirection.   R: Pt has no insight and is constantly changing the subject during her interactions with staff in an attempt to appear rational, when she is in fact delusional. Pt finally took her medications at 2245 after over an hour of irrational discussion on the matter. Pt is sleeping more now than on previous nights with increased medication dosage.

## 2014-10-12 NOTE — [facility] Group Notes (Signed)
[facility] Group Notes:  (Nursing/MHT/Case Management/Adjunct)  Date:  10/12/2014  Time:  8:52 AM  Type of Therapy:  goals  Participation Level:  Did Not Attend  P ry of Progress/Problems:  Gloria Frank 10/12/2014, 8:52 AM

## 2014-10-12 NOTE — Progress Notes (Signed)
Patient slept through morning. Alert upon awakening. Mood is euthymic with blunted affect. Unable to assess SI or HI. Unable to determine hallucinations. Patient is very disorganized and illogical. Unresponsive to reality orientation. Unable to participate in milieu and groups.  Patient did take medications upon awakening.  Continue with current treatment plan, reality orientation as tolerated. Maintain 15 minute checks for safety.

## 2014-10-12 NOTE — [facility] Group Notes (Signed)
[facility] LCSW Group Therapy  10/12/2014 4:18 PM  Type of Therapy:  Group Therapy  Participation Level:  Did Not Attend  Modes of Intervention:  Discussion, Education, Socialization and Support  Summary of Progress/Problems: Pts were asked to identify what balance means to them. They were encouraged to identify what throws them off balance and how to regain balance.    Gloria Frank L Deshawnda Acrey MSW, LCSWA  10/12/2014, 4:18 PM  

## 2014-10-13 NOTE — Progress Notes (Signed)
Pt continues to be intrusive and some what demanding.Pt needs frequent redirecting. Pt continues to be compliant with taking po meds. Pt's mood and affect has been depressed. Pt denies SI and A/V hallucinations.

## 2014-10-13 NOTE — Plan of Care (Signed)
Problem: Alteration in thought process Goal: LTG-Patient behavior demonstrates decreased signs psychosis (Patient behavior demonstrates decreased signs of psychosis to the point the patient is safe to return home and continue treatment in an outpatient setting.)  Outcome: Not Progressing Pt continues to be suspicious

## 2014-10-13 NOTE — Progress Notes (Signed)
Patient ID: Gloria Frank, female   DOB: 08/06/1970, 44 y.o.   MRN: [id number]   CSW confirmed with Britta Mccreedy [phone]) at [facility] admissions, pt is still on wait list.   Rondall Allegra MSW, [facility]  10/13/2014 11:03 AM

## 2014-10-13 NOTE — Progress Notes (Signed)
Patient requires frequent redirection and is intrusive at time. She sometimes needs redirection to stay on task. She remains anxious but she was medicine complaint. She paced hallways most of the evening until about 0130 , she appears to be resting quietly in bed.

## 2014-10-13 NOTE — Progress Notes (Signed)
[facility] MD Progress Note  10/13/2014 10:28 AM Gloria Frank  MRN:  [id number] Subjective:  Follow-up for this 44 year old AA  woman with schizophrenia as well as high blood pressure. Patient came in and displayed her menu selection for her meals for tomorrow. I asked her if she had questions about it and she did not. She indicated she was just getting the "house" meals. I asked if she had been making selections and she said no. I did discuss this with staff and they indicated been trying to get her to make her selections since she has been here but she has not been doing so. She then discussed wanting discharge and stated that her fianc is things going on in her children of things going on and that she wanted to be discharged. She discussed that her children are finishing high school in [place] and that she has spent time in [place] and there is some possibility she may relocate to [place] with her fianc and her children may attend school there. Principal Problem: Schizophrenia, unspecified type Diagnosis:   Patient Active Problem List   Diagnosis Date Noted  . Schizophrenia, unspecified type [F20.9] 09/24/2014  . High blood pressure [I10] 09/23/2014   Total Time spent with patient: 30 minutes   Past Medical History: History reviewed. No pertinent past medical history. History reviewed. No pertinent past surgical history. Family History: History reviewed. No pertinent family history. Social History:  History  Alcohol Use No     History  Drug Use No    History   Social History  . Marital Status: Married    Spouse Name: N/A  . Number of Children: N/A  . Years of Education: N/A   Social History Main Topics  . Smoking status: Never Smoker   . Smokeless tobacco: Not on file  . Alcohol Use: No  . Drug Use: No  . Sexual Activity: Not on file   Other Topics Concern  . None   Social History Narrative   Additional History:   patient remains very psychotic. Does appear  to want to engage although is very loose in her thought process. Sleep: Fair  Appetite:  Fair   Assessment: Patient appears to have schizophrenia with probably a history of an adequate treatment with no insight. Still very psychotic. Not able to reach anyone else in the family who may help with discharge planning either  Musculoskeletal: Strength & Muscle Tone: within normal limits Gait & Station: normal Patient leans: N/A   Psychiatric Specialty Exam: Physical Exam  Nursing note and vitals reviewed. Constitutional: She appears well-developed and well-nourished.  HENT:  Head: Normocephalic and atraumatic.  Eyes: Conjunctivae are normal. Pupils are equal, round, and reactive to light.  Neck: Normal range of motion.  Cardiovascular: Normal heart sounds.   Respiratory: Effort normal.  GI: Soft.  Musculoskeletal: Normal range of motion.  Neurological: She is alert.  Skin: Skin is warm and dry.  Psychiatric: Her affect is angry and blunt. Her speech is delayed. She is withdrawn. Thought content is paranoid and delusional. Cognition and memory are impaired. She expresses inappropriate judgment.    Review of Systems  Constitutional: Negative.   HENT: Negative.   Eyes: Negative.   Respiratory: Negative.   Cardiovascular: Negative.   Gastrointestinal: Negative.   Musculoskeletal: Negative.   Skin: Negative.   Neurological: Negative.   Psychiatric/Behavioral: Negative for depression, suicidal ideas, hallucinations, memory loss and substance abuse. The patient is not nervous/anxious and does not have insomnia.  Blood pressure 113/75, pulse 91, temperature 97.9 F (36.6 C), temperature source Oral, resp. rate 20, height 5\' 4"  (1.626 m), weight 79.379 kg (175 lb), SpO2 100 %.Body mass index is 30.02 kg/(m^2).  General Appearance: Bizarre  Eye Contact::  Fair  Speech:  Normal Rate  Volume:  Decreased  Mood:  Good  Affect:  Flat   Thought Process:  Disorganized  Orientation:   Full (Time, Place, and Person)  Thought Content:  Delusions  Suicidal Thoughts:  No  Homicidal Thoughts:  No  Memory:  Immediate;   Good Recent;   Fair Remote;   Poor  Judgement:  Impaired  Insight:  Lacking  Psychomotor Activity:  Decreased  Concentration:  Poor  Recall:  Poor  Fund of Knowledge:Poor  Language: Poor  Akathisia:  No  Handed:  Right  AIMS (if indicated):     Assets:  Communication Skills Physical Health  ADL's:  Intact  Cognition: WNL  Sleep:  Number of Hours: 4.25     Current Medications: Current Facility-Administered Medications  Medication Dose Route Frequency Provider Last Rate Last Dose  . acetaminophen (TYLENOL) tablet 650 mg  650 mg Oral Q6H PRN Audery Amel, MD   650 mg at 10/04/14 0127  . alum & mag hydroxide-simeth (MAALOX/MYLANTA) 200-200-20 MG/5ML suspension 30 mL  30 mL Oral Q4H PRN Audery Amel, MD   30 mL at 10/13/14 1007  . haloperidol (HALDOL) tablet 2 mg  2 mg Oral Q6H PRN Beau Fanny, MD   2 mg at 10/04/14 1022   Or  . haloperidol lactate (HALDOL) injection 2 mg  2 mg Intramuscular Q6H PRN Beau Fanny, MD      . lisinopril (PRINIVIL,ZESTRIL) tablet 5 mg  5 mg Oral Daily Audery Amel, MD   5 mg at 10/13/14 0902  . loratadine (CLARITIN) tablet 10 mg  10 mg Oral Daily Jimmy Footman, MD   10 mg at 10/13/14 0902  . LORazepam (ATIVAN) tablet 1 mg  1 mg Oral Q6H PRN Beau Fanny, MD   1 mg at 10/04/14 1021   Or  . LORazepam (ATIVAN) injection 1 mg  1 mg Intramuscular Q6H PRN Beau Fanny, MD      . magnesium hydroxide (MILK OF MAGNESIA) suspension 30 mL  30 mL Oral Daily PRN Audery Amel, MD   30 mL at 10/06/14 0026  . risperiDONE (RISPERDAL M-TABS) disintegrating tablet 10 mg  10 mg Oral QHS Beau Fanny, MD   10 mg at 10/12/14 2144    Lab Results: No results found for this or any previous visit (from the past 48 hour(s)).  Physical Findings: AIMS: Facial and Oral Movements Muscles of Facial Expression: None,  normal Lips and Perioral Area: None, normal Jaw: None, normal Tongue: None, normal,Extremity Movements Upper (arms, wrists, hands, fingers): None, normal Lower (legs, knees, ankles, toes): None, normal, Trunk Movements Neck, shoulders, hips: None, normal, Overall Severity Severity of abnormal movements (highest score from questions above): None, normal Incapacitation due to abnormal movements: None, normal Patient's awareness of abnormal movements (rate only patient's report): No Awareness, Dental Status Current problems with teeth and/or dentures?: No Does patient usually wear dentures?: No  CIWA:  CIWA-Ar Total: 0 COWS:     Treatment Plan Summary: Psychosis. Patient continues to be disorganized but does seem to be engaging a bit more and is slightly more pleasant then to descriptions of her from earlier in admission. Continue and watch pt with the current dose  of Risperidone M tab 10 mg Qhs.  Hypertension-blood pressure within normal limits. Continue lisinopril.  Irritability-none currently, lorazepam and Haldol as needed.  Medical Decision Making:  Established Problem, Worsening (2), Review of Medication Regimen & Side Effects (2) and Review of New Medication or Change in Dosage (2)     Wallace Going 10/13/2014, 10:28 AM

## 2014-10-13 NOTE — [facility] Group Notes (Signed)
[facility] LCSW Group Therapy  10/13/2014 3:21 PM  Type of Therapy:  Group Therapy  Participation Level:  Did Not Attend  Modes of Intervention:  Discussion, Education, Socialization and Support  Summary of Progress/Problems: Patients identify obstacles, self-sabotaging and enabling behaviors. Patients explore aspects of self sabotage and enabling and how to limit these self-destructive behaviors in everyday life.   Dorianna Mckiver L Varsha Knock MSW, LCSWA  10/13/2014, 3:21 PM  

## 2014-10-14 MED ORDER — RISPERIDONE 1 MG PO TBDP
6.0000 mg | ORAL_TABLET | Freq: Every day | ORAL | Status: DC
  Administered 2014-10-14: 6 mg via ORAL
  Filled 2014-10-14: qty 6

## 2014-10-14 NOTE — [facility] Group Notes (Signed)
[facility] Group Notes:  (Nursing/MHT/Case Management/Adjunct)  Date:  10/14/2014  Time:  1:40 PM  Type of Therapy:  Psychoeducational Skills  Participation Level:  Did Not Attend  Summary of Progress/Problems:  Earlie Arciga R Shiree Altemus 10/14/2014, 1:40 PM

## 2014-10-14 NOTE — Progress Notes (Signed)
[facility] MD Progress Note  10/14/2014 3:50 PM Gloria Frank  MRN:  [id number]   Subjective:  Patient was seen today. She was calm and pleasant. Not argumentative with me. She continues to be disorganized and is very hard to follow her train of thought most of her ideas are not well-connected. She denies having any hallucinations, suicidal ideation or homicidal ideation. She does complain of sedation with her medications but denies any other side effects. She denies major physical complaints other than constipation, indigestion and running out. Patient does not report having any issues with his sleep, appetite, energy or with concentration. We talk about the possibility of discharging her to a group home. The patient however tells me she does not think she has Medicaid or disability. The patient constantly talks about her family in several places there is not really any supportive family.  Barriers staff she has only been sleeping about 5 hours. Her oral intake today has been poor and she did not have any breakfast or lunch but in prior this patient's oral intake was much better around 100%.  Per nursing staff : "Pt visible on the unit. Continues to wear underpants over her hair. Pt tells this writer she wears it because her uterus could be collapsed. Pt continues to have many requests. Med compliant. Pt concerned over medication she will be on when discharged. Denies any worry issues. "  Principal Problem: Schizophrenia, unspecified type Diagnosis:   Patient Active Problem List   Diagnosis Date Noted  . Schizophrenia, unspecified type [F20.9] 09/24/2014  . High blood pressure [I10] 09/23/2014   Total Time spent with patient: 30 minutes   Past Medical History: History reviewed. No pertinent past medical history. History reviewed. No pertinent past surgical history. Family History: History reviewed. No pertinent family history. Social History:  History  Alcohol Use No     History  Drug  Use No    History   Social History  . Marital Status: Married    Spouse Name: N/A  . Number of Children: N/A  . Years of Education: N/A   Social History Main Topics  . Smoking status: Never Smoker   . Smokeless tobacco: Not on file  . Alcohol Use: No  . Drug Use: No  . Sexual Activity: Not on file   Other Topics Concern  . None   Social History Narrative    Sleep: Fair  Appetite:  Fair    Musculoskeletal: Strength & Muscle Tone: within normal limits Gait & Station: normal Patient leans: N/A   Psychiatric Specialty Exam: Physical Exam  Nursing note and vitals reviewed. Constitutional: She appears well-developed.  HENT:  Head: Atraumatic.  Psychiatric: Her affect is angry and blunt. Her speech is delayed. She is withdrawn. Thought content is paranoid and delusional. Cognition and memory are impaired. She expresses inappropriate judgment.    Review of Systems  Constitutional: Negative.   HENT: Positive for congestion.   Eyes: Negative.   Respiratory: Negative.   Cardiovascular: Negative.   Gastrointestinal: Positive for heartburn and constipation.  Musculoskeletal: Negative.   Skin: Negative.   Neurological: Negative.   Psychiatric/Behavioral: Negative for depression, suicidal ideas, hallucinations, memory loss and substance abuse. The patient is not nervous/anxious and does not have insomnia.     Blood pressure 113/75, pulse 91, temperature 97.9 F (36.6 C), temperature source Oral, resp. rate 20, height 5\' 4"  (1.626 m), weight 79.379 kg (175 lb), SpO2 100 %.Body mass index is 30.02 kg/(m^2).  General Appearance: Bizarre  Eye  Contact::  Fair  Speech:  Normal Rate  Volume:  Decreased  Mood:  Good  Affect:  Flat   Thought Process:  Disorganized  Orientation:  Full (Time, Place, and Person)  Thought Content:  Delusions  Suicidal Thoughts:  No  Homicidal Thoughts:  No  Memory:  Immediate;   Good Recent;   Fair Remote;   Poor  Judgement:  Impaired   Insight:  Lacking  Psychomotor Activity:  Decreased  Concentration:  Poor  Recall:  Poor  Fund of Knowledge:Poor  Language: Poor  Akathisia:  No  Handed:  Right  AIMS (if indicated):     Assets:  Communication Skills Physical Health  ADL's:  Intact  Cognition: WNL  Sleep:  Number of Hours: 5     Current Medications: Current Facility-Administered Medications  Medication Dose Route Frequency Provider Last Rate Last Dose  . acetaminophen (TYLENOL) tablet 650 mg  650 mg Oral Q6H PRN Audery Amel, MD   650 mg at 10/04/14 0127  . alum & mag hydroxide-simeth (MAALOX/MYLANTA) 200-200-20 MG/5ML suspension 30 mL  30 mL Oral Q4H PRN Audery Amel, MD   30 mL at 10/13/14 1007  . lisinopril (PRINIVIL,ZESTRIL) tablet 5 mg  5 mg Oral Daily Audery Amel, MD   5 mg at 10/13/14 0902  . loratadine (CLARITIN) tablet 10 mg  10 mg Oral Daily Jimmy Footman, MD   10 mg at 10/13/14 0902  . magnesium hydroxide (MILK OF MAGNESIA) suspension 30 mL  30 mL Oral Daily PRN Audery Amel, MD   30 mL at 10/06/14 0026  . risperiDONE (RISPERDAL M-TABS) disintegrating tablet 6 mg  6 mg Oral QHS Jimmy Footman, MD        Lab Results: No results found for this or any previous visit (from the past 48 hour(s)).  Physical Findings: AIMS: Facial and Oral Movements Muscles of Facial Expression: None, normal Lips and Perioral Area: None, normal Jaw: None, normal Tongue: None, normal,Extremity Movements Upper (arms, wrists, hands, fingers): None, normal Lower (legs, knees, ankles, toes): None, normal, Trunk Movements Neck, shoulders, hips: None, normal, Overall Severity Severity of abnormal movements (highest score from questions above): None, normal Incapacitation due to abnormal movements: None, normal Patient's awareness of abnormal movements (rate only patient's report): No Awareness, Dental Status Current problems with teeth and/or dentures?: No Does patient usually wear dentures?:  No  CIWA:  CIWA-Ar Total: 0 COWS:     Treatment Plan Summary:  44 year old with  diagnosis of schizophrenia. Presented to our emergency department under petition due to bizarre behavior and paranoia. Patient is a poor historian at this time and there is no collateral information. Per records looks the patient was in our system backing 2002 for psychosis in [facility] emergency department.  Schizophrenia: Continue Risperdal M-tab but we will decrease the dose from 10 mg daily at bedtime to 6 mg qhs as patient complains of sedation.Some improvement since admission as patient is not as suspicious or argumentative. She was calm and pleasant today. She is taking her medications are per as prescribed. She continues to deny having a mental illness and continues to have disorganized thought processes.  Hypertension continue lisinopril 5 mg by mouth daily. Consider b blocker instead as HR has been >100  Labs: TSH and hemoglobin A1c and lipid panel were within the normal limits  Collateral information: son confirmed diagnosis of schizophrenia. They are not very close. He says she was living in a facility in [place].  Precautions  continue every 15 minute checks  Hospitalization status continue involuntary commitment  On waiting list for [facility]  Discharge planning: It is unclear as to what was her living situation prior to admission.  Patient agrees with placement into a group home. Social worker is to find out whether the patient has funding.  The family is description looks like she was living in a group home or assisted living facility.    Medical Decision Making:  Established Problem, Stable/Improving (1), Review of Medication Regimen & Side Effects (2) and Review of New Medication or Change in Dosage (2)     Jimmy Footman 10/14/2014, 3:50 PM

## 2014-10-14 NOTE — Progress Notes (Signed)
LCSW called [facility] and patient remains on wait list for [facility]. Nurses notified

## 2014-10-14 NOTE — Progress Notes (Signed)
D: Patient remains disorganized.Thought process remain altered.  Out of bed late this shift, no breakfast ACE inhibitor therapy was not prescribed due to .No Unit programing . Limited involvement  with her peers. Continues to walk around with a pair of panties on her head and a pair of scrubs  .  A: Encourage  Patient to attend unit programing .Continueal instructions on medications redirection .with person place and time  R: Receptive, but remains confused

## 2014-10-14 NOTE — Plan of Care (Signed)
Problem: Alteration in thought process Goal: LTG-Patient behavior demonstrates decreased signs psychosis (Patient behavior demonstrates decreased signs of psychosis to the point the patient is safe to return home and continue treatment in an outpatient setting.)  Outcome: Not Progressing Thoughts remained disorganized

## 2014-10-14 NOTE — [facility] Group Notes (Addendum)
[facility] LCSW Aftercare Discharge Planning Group Note  10/14/2014 11:40 AM  Participation Quality:  DID not attend  Affect:    Cognitive:    Insight:    Engagement in Group:    Modes of Intervention:    Summary of Progress/Problems:  Gloria Frank 10/14/2014, 11:40 AM

## 2014-10-14 NOTE — Progress Notes (Signed)
Pt visible on the unit. Continues to wear underpants over her hair. Pt tells this writer she wears it because her uterus could be collapsed. Pt continues to have many requests. Med compliant. Pt concerned over medication she will be on when discharged. Denies any worry issues.

## 2014-10-15 MED ORDER — PALIPERIDONE ER 3 MG PO TB24: 12.0000 mg | ORAL_TABLET | Freq: Every day | ORAL | Status: DC

## 2014-10-15 MED ORDER — RISPERIDONE 1 MG PO TBDP
8.0000 mg | ORAL_TABLET | Freq: Every day | ORAL | Status: DC
  Administered 2014-10-15 – 2014-10-27 (×13): 8 mg via ORAL
  Filled 2014-10-15 (×13): qty 8

## 2014-10-15 MED ORDER — ATENOLOL 12.5 MG HALF TABLET
12.5000 mg | ORAL_TABLET | Freq: Every day | ORAL | Status: DC
  Administered 2014-10-17 – 2014-10-29 (×13): 12.5 mg via ORAL
  Filled 2014-10-15 (×16): qty 1

## 2014-10-15 NOTE — Progress Notes (Signed)
D: Patient stated slept good last night .Stated appetite is fair and energy level  Is normal. Stated concentration is good . Stated on Depression scale 0 , hopeless 0 and anxiety 0 .( low 0-10 high) Denies suicidal  homicidal ideations  .  No auditory hallucinations  No pain concerns . Appropriate ADL'S. Interacting with peers and staff.  Patient voice of her focus today is discharge planning . Thought process remain altered but noted improving . Upset about having to go to a state hospital . Feels  the staff already knows where hr family is . Constantly coming to nursing station. A: Encourage patient participation with unit programming . Instruction  Given on  Medication , verbalize understanding. R: Voice no other concerns. Staff continue to monitor

## 2014-10-15 NOTE — Plan of Care (Signed)
Problem: Alteration in thought process Goal: STG-Patient is able to discuss thoughts with staff Outcome: Progressing Pt approached staff about discharge planning.

## 2014-10-15 NOTE — Clinical Social Work Note (Signed)
CSW obtained verbal consent from patient to speak with her exhusband/children's father Link Snuffer [phone] and CSW spoke with Link Snuffer as patient is disorganized and not able to share information about her income, previous living situation. Eddie shared that patient has a cousin who lives in [place], [place] and her father lives in [place] but most of her family lives in [place], [place]. Eddie stated that patient previously lived in [place] in an assisted-living facility on what he thought was [address] or [address] but was not sure. He stated that he has custody of the children and that patient was doing well but would get paranoid and not take her medications but has not heard from her in 1 year.

## 2014-10-15 NOTE — [facility] Group Notes (Signed)
[facility] LCSW Group Therapy  10/15/2014 1:08 PM  Type of Therapy:  Group Therapy  Participation Level:  Active  Participation Quality:  Appropriate  Affect:  Appropriate  Cognitive:  Disorganized  Insight:  Developing/Improving  Engagement in Therapy:  Developing/Improving  Modes of Intervention:  Discussion, Exploration, Problem-solving and Support  Summary of Progress/Problems:LCSW introduced and had to enforce group rules today. Patients were very emotional/loud and today's topic was changed to emotional regulation and problem solving. Group members were asked to participate in expressing positive and negative emotions they experience. Several examples where emotions could run high were discussed and then we explored ways to step back- STOP- and diffuse.  Arrie Senate M 10/15/2014, 1:08 PM

## 2014-10-15 NOTE — Clinical SW OB High Risk (Cosign Needed)
CSW called [employer] provided line to request Care Coordinator as patient is homeless and uninsured with no family support or services in place that staff is aware of.

## 2014-10-15 NOTE — Progress Notes (Signed)
Pt visible on the unit. Walking about hall with underwear on her head. Pt still with many small requests. Pt talked with this writer at length about possible discharge plans. Uncertain if she will be going to [place]. States she has family there. Inquiring about services available here like in [place] like [place].Med compliant.

## 2014-10-15 NOTE — [facility] Group Notes (Signed)
[facility] LCSW Group Therapy  10/15/2014 8:03 AM  Type of Therapy:  Group Therapy  Participation Level:  Minimal  Participation Quality:  Intrusive  Affect:  Defensive  Cognitive:  Appropriate  Insight:  Developing/Improving  Engagement in Therapy:  Developing/Improving  Modes of Intervention:  Discussion, Exploration and Support  Summary of Progress/Problems:Today's topic was communication and barriers patients face when attempts to communicate feelings and needs are not met. Patients were asked to discuss their challenges and as a collective group, members provided insight and suggestions on more effective means of communication. This patient was able to sit and listen for about 10 minutes and then left her group. Her thoughts continue to be disorganized. She was encouraged to stay but chose to left group shortly after it started with minimal contribution.  Enis Slipper M 10/15/2014, 8:03 AM

## 2014-10-15 NOTE — Tx Team (Signed)
Interdisciplinary Treatment Plan Update (Adult)  Date:  10/15/2014 Time Reviewed:  4:15 PM  Progress in Treatment: Attending groups: Yes. Participating in groups:  Yes. Taking medication as prescribed:  Yes. Tolerating medication:  Yes. Family/Significant othe contact made:  Yes, called patient's exhusband/children's father Link Snuffer [phone] Patient understands diagnosis:  No. and As evidenced by:  Limited insight  Discussing patient identified problems/goals with staff:  Yes. Medical problems stabilized or resolved:  Yes. Denies suicidal/homicidal ideation: Yes. Issues/concerns per patient self-inventory:  No. Other:  New problem(s) identified: NA  Discharge Plan or Barriers: Pt is currently on the [facility] waitlist. Patient has no supportive family and very limited information on patient's previous history. Patient is referred for Care Coordinator at [facility]. Patient is understood to be uninsured and homeless at this time from [facility] and will need followup care at discharge but will likely go to [facility] first. Patient continues to present paranoid, bizarre behaviors wearing underwear on her head and disorganized.   Reason for Continuation of Hospitalization: Delusions  Medication stabilization  Comments:Follow-up for this 44 year old woman with schizophrenia as well as high blood pressure Pt seen in her room then in the hall way. Pt is seen pacing up and down all the time and though she attends Groups she is rather distracted and is disrupitve. At times. Today she reports that she is sleeping until 11.00am and she is used to wake up at 5.00am. P t was informed that she should go to bed early she reports that she is from [place] and so she cannot do that.. When questions are asked she answers and most of it disconnected and dis- jointed and does not make much sense. Very disorganized in her thoughts and her answers. Staff reports that she has not been agitated and not been  argumentative. This appears to be her baseline functioning and discussed in TT about discharge if she continues to be the same.  Estimated length of stay: up to 7 days, estimated discharge Tuesday 10/22/14  New goal(s): NA  Review of initial/current patient goals per problem list:   See Care Plan  Attendees: Physician:  Radene Journey, MD  8/2/20164:15 PM  Nursing:   Elenore Paddy, RN  8/2/20164:15 PM  Clinical Social Worker: Beryl Meager, LCSWA 8/2/20164:15 PM   Scribe for Treatment Team:   Lulu Riding, MSW, LCSWA  10/15/2014, 4:15 PM

## 2014-10-15 NOTE — Plan of Care (Signed)
Problem: Alteration in thought process Goal: LTG-Patient behavior demonstrates decreased signs psychosis (Patient behavior demonstrates decreased signs of psychosis to the point the patient is safe to return home and continue treatment in an outpatient setting.)  Outcome: Progressing Patient is demonstrating decreased signs of psychosis, thoughts are not delusional, but speech is still disorganized and tangential.

## 2014-10-15 NOTE — Progress Notes (Signed)
D: Patient denies SI/HI/AVH.Patient is pleasant and cooperative, affect is flat but brightens upon approach, thought process is disorganized and tangential, she appears less anxious and is interacting with peers and staff appropriately.  A: Pt was offered support and encouragement. Pt was given scheduled medications. Pt was encouraged to attend groups. Q 15 minute checks were done for safety.  R:Pt attends groups and interacts well with peers and staff. Pt is taking medication. Pt receptive to treatment and safety maintained on unit.

## 2014-10-15 NOTE — Progress Notes (Addendum)
[facility] MD Progress Note  10/15/2014 9:21 AM Gloria Frank  MRN:  [id number]   Subjective:  Patient was seen today. She was more argumentative and suspicious than yesterday. Appears to become more argumentative every time the medications or medication changes are discussed with her. She refuses to try any different medications other than Risperdal.  She continues to be disorganized and is very hard to follow her train of thought most of her ideas are not well-connected. She denies having any hallucinations, suicidal ideation or homicidal ideation. She does complain of sedation with her medications but denies any other side effects. She denies major physical complaints today. Patient does not report having any issues with his sleep, appetite, energy or with concentration. We talk about the possibility of discharging her to a group home. However when we start talking about discharge planning she does not appear to be oriented into reality as she is talking about moving to [place] with a friend as all her children will be moving there with her which is not true based on my conversation with her son. Her son was removed from her custody when he was a child due to him patient having severe mental illness and they do not have a close relationship. Continues to be compliant with medication   Per nursing staff :  Pt visible on the unit. Walking about hall with underwear on her head. Pt still with many small requests. Pt talked with this writer at length about possible discharge plans. Uncertain if she will be going to [place]. States she has family there. Inquiring about services available here like in [place] like [place].Med compliant.          Principal Problem: Schizophrenia, unspecified type Diagnosis:   Patient Active Problem List   Diagnosis Date Noted  . Schizophrenia, unspecified type [F20.9] 09/24/2014  . High blood pressure [I10] 09/23/2014   Total Time spent with patient: 30  minutes   Past Medical History: History reviewed. No pertinent past medical history. History reviewed. No pertinent past surgical history. Family History: History reviewed. No pertinent family history. Social History:  History  Alcohol Use No     History  Drug Use No    History   Social History  . Marital Status: Married    Spouse Name: N/A  . Number of Children: N/A  . Years of Education: N/A   Social History Main Topics  . Smoking status: Never Smoker   . Smokeless tobacco: Not on file  . Alcohol Use: No  . Drug Use: No  . Sexual Activity: Not on file   Other Topics Concern  . None   Social History Narrative    Sleep: Fair 5 h  Appetite:  Fair    Musculoskeletal: Strength & Muscle Tone: within normal limits Gait & Station: normal Patient leans: N/A   Psychiatric Specialty Exam: Physical Exam  Nursing note and vitals reviewed. Constitutional: She appears well-developed.  HENT:  Head: Atraumatic.  Psychiatric: Her affect is angry and blunt. Her speech is delayed. She is withdrawn. Thought content is paranoid and delusional. Cognition and memory are impaired. She expresses inappropriate judgment.    Review of Systems  Constitutional: Negative.   HENT: Positive for congestion.   Eyes: Negative.   Respiratory: Negative.   Cardiovascular: Negative.   Gastrointestinal: Positive for constipation. Negative for heartburn.  Musculoskeletal: Negative.   Skin: Negative.   Neurological: Negative.   Psychiatric/Behavioral: Negative for depression, suicidal ideas, hallucinations, memory loss and substance abuse. The patient  is not nervous/anxious and does not have insomnia.     Blood pressure 123/87, pulse 122, temperature 98 F (36.7 C), temperature source Oral, resp. rate 18, height  (1.626 m), weight 79.379 kg (175 lb), SpO2 100 %.Body mass index is 30.02 kg/(m^2).  General Appearance: Bizarre  Eye Contact::  Fair  Speech:  Normal Rate  Volume:   Decreased  Mood:  Good  Affect:  Flat   Thought Process:  Disorganized  Orientation:  Full (Time, Place, and Person)  Thought Content:  Delusions  Suicidal Thoughts:  No  Homicidal Thoughts:  No  Memory:  Immediate;   Good Recent;   Fair Remote;   Poor  Judgement:  Impaired  Insight:  Lacking  Psychomotor Activity:  Decreased  Concentration:  Poor  Recall:  Poor  Fund of Knowledge:Poor  Language: Poor  Akathisia:  No  Handed:  Right  AIMS (if indicated):     Assets:  Communication Skills Physical Health  ADL's:  Intact  Cognition: WNL  Sleep:  Number of Hours: 5     Current Medications: Current Facility-Administered Medications  Medication Dose Route Frequency Provider Last Rate Last Dose  . acetaminophen (TYLENOL) tablet 650 mg  650 mg Oral Q6H PRN Audery Amel, MD   650 mg at 10/04/14 0127  . alum & mag hydroxide-simeth (MAALOX/MYLANTA) 200-200-20 MG/5ML suspension 30 mL  30 mL Oral Q4H PRN Audery Amel, MD   30 mL at 10/13/14 1007  . lisinopril (PRINIVIL,ZESTRIL) tablet 5 mg  5 mg Oral Daily Audery Amel, MD   5 mg at 10/15/14 0917  . loratadine (CLARITIN) tablet 10 mg  10 mg Oral Daily Jimmy Footman, MD   10 mg at 10/15/14 0917  . magnesium hydroxide (MILK OF MAGNESIA) suspension 30 mL  30 mL Oral Daily PRN Audery Amel, MD   30 mL at 10/06/14 0026  . risperiDONE (RISPERDAL M-TABS) disintegrating tablet 6 mg  6 mg Oral QHS Jimmy Footman, MD   6 mg at 10/14/14 2101    Lab Results: No results found for this or any previous visit (from the past 48 hour(s)).  Physical Findings: AIMS: Facial and Oral Movements Muscles of Facial Expression: None, normal Lips and Perioral Area: None, normal Jaw: None, normal Tongue: None, normal,Extremity Movements Upper (arms, wrists, hands, fingers): None, normal Lower (legs, knees, ankles, toes): None, normal, Trunk Movements Neck, shoulders, hips: None, normal, Overall Severity Severity of abnormal  movements (highest score from questions above): None, normal Incapacitation due to abnormal movements: None, normal Patient's awareness of abnormal movements (rate only patient's report): No Awareness, Dental Status Current problems with teeth and/or dentures?: No Does patient usually wear dentures?: No  CIWA:  CIWA-Ar Total: 0 COWS:     Treatment Plan Summary:  44 year old with  diagnosis of schizophrenia. Presented to our emergency department under petition due to bizarre behavior and paranoia. Patient is a poor historian at this time and there is no collateral information. Per records looks the patient was in our system backing 2002 for psychosis in [facility] emergency department.  Schizophrenia: I discussed with her the possibility of trying Invega instead of Risperdal as it is possible we can go higher on the dose of Invega without having as much sedation as it is seen with the higher dose of Risperdal. Also  she could benefit from invaga sustenna. She refused to change Risperdal to [place]. Therefore I we'll increase the risperidone to 8 mg at bedtime.  Patient was very argumentative today, suspicious but somewhat less disorganized . She continues to deny having a mental illness and continues to have disorganized thought processes.  Hypertension: I will discontinue lisinopril and instead start a beta blocker as her heart rate has been elevated and today appears to be 122.  Labs: TSH and hemoglobin A1c and lipid panel were within the normal limits  Collateral information: son confirmed diagnosis of schizophrenia. They are not very close. He says she was living in a facility in [place].  Precautions continue every 15 minute checks  Hospitalization status continue involuntary commitment  On waiting list for [facility]  Discharge planning: It is unclear as to what was her living situation prior to admission.  Patient agrees with placement into a group home. Social worker is to find out  whether the patient has funding.  The family is description looks like she was living in a group home or assisted living facility.  Social worker attempted to obtain information from [place] but they do not have any records of this patient in their system.  Medical Decision Making:  Established Problem, Stable/Improving (1), Review of Medication Regimen & Side Effects (2) and Review of New Medication or Change in Dosage (2)     Jimmy Footman 10/15/2014, 9:21 AM

## 2014-10-15 NOTE — [facility] Group Notes (Signed)
[facility] Group Notes:  (Nursing/MHT/Case Management/Adjunct)  Date:  10/15/2014  Time:  5:23 PM  Type of Therapy:  Psychoeducational Skills  Participation Level:  Did Not Attend   Summary of Progress/Problems:  Foy Guadalajara 10/15/2014, 5:23 PM

## 2014-10-16 NOTE — [facility] Group Notes (Signed)
[facility] LCSW Aftercare Discharge Planning Group Note  10/16/2014 2:30 PM  Participation Quality:  Appropriate  Affect:  Appropriate  Cognitive:  Appropriate  Insight:  Developing/Improving  Engagement in Group:  Developing/Improving  Modes of Intervention:  Confrontation  Summary of Progress/Problems:LCSW introduced group rules. This peer lead group today was challenging patient was able to remain poised and quiet and contributed to calming other group members down when they were agitated Her gaol is to be discharged today  Gloria Frank 10/16/2014, 2:30 PM

## 2014-10-16 NOTE — Progress Notes (Signed)
[facility] MD Progress Note  10/16/2014 9:42 AM Gloria Frank  MRN:  [id number]   Subjective: Yesterday the patient was very argumentative, suspicious, delusional, disorganized and paranoid. She is stated several times that I was not her doctor and that her social worker was not her Child psychotherapist. She requested for asked not to address her anymore as we should not have been involved with her care.  Patient continues to walk around the unit wearing underwear on her head. Psychotic symptoms appear more florid when patient is confronted with information she dislikes such as issues related with medications and discharge plans to transfer her to the state facility.  This patient has no disability or Medicaid. There is no family in the area that is supportive even though she claims her children are supportive.  We have contacted the patient's ex husband  and one of her sons and they do not have any information about her or any contact with her on a regular basis.  Patient states she is a resident of [place] and at other times she is states she is going to move to [place] with friends and with her children. However she is unable to provide any contact information for anybody neither and address.  At this point we do not have any ability to place her in a group home or to set her up with ACT team  services as she is uninsured.  Patient refuses to make any changes with her medications and is very reluctant to even increase in the dose of medications.   She is not an immediate threat to the safety of her peers or staff, there is no agitation, aggression in order to justify forcing injectable anti-psychotics.  She is willing to continue the treatment with the recommended dose of risperidone however this medication has only partially helped her.  This patient is in need of guardianship, disability and placement.  Due to her level of disorganization she will not be able to safely care for herself and not sure her  stability. If discharged to a homeless shelter the patient is likely to decompensate immediately as is very likely she will not continue any of her anti-psychotics. She has refused to receive any long-acting injectable anti-psychotics. We have strongly feel that he is in the best interest of the patient to be transferred to a long-term care unit where all the social issues could be addressed.   Patient was seen today. She was more argumentative and suspicious than yesterday. Appears to become more argumentative every time the medications or medication changes are discussed with her. She continues to be disorganized and is very hard to follow her train of thought most of her ideas are not well-connected. She denies having any hallucinations, suicidal ideation or homicidal ideation. She does complain of sedation with her medications but denies any other side effects. She denies major physical complaints today. Patient does not report having any issues with his sleep, appetite, energy or with concentration. Continues to be compliant with medication   Per nursing staff :  D: Patient stated slept good last night .Stated appetite is fair and energy level Is normal. Stated concentration is good . Stated on Depression scale 0 , hopeless 0 and anxiety 0 .( low 0-10 high) Denies suicidal homicidal ideations . No auditory hallucinations No pain concerns . Appropriate ADL'S. Interacting with peers and staff. Patient voice of her focus today is discharge planning . Thought process remain altered but noted improving . Upset about having  to go to a state hospital . Feels the staff already knows where hr family is . Constantly coming to nursing station. A: Encourage patient participation with unit programming . Instruction Given on Medication , verbalize understanding. R: Voice no other concerns. Staff continue to monitor   Principal Problem: Schizophrenia, unspecified type Diagnosis:   Patient Active Problem  List   Diagnosis Date Noted  . Schizophrenia, unspecified type [F20.9] 09/24/2014  . High blood pressure [I10] 09/23/2014   Total Time spent with patient: 30 minutes   Past Medical History: History reviewed. No pertinent past medical history. History reviewed. No pertinent past surgical history. Family History: History reviewed. No pertinent family history. Social History:  History  Alcohol Use No     History  Drug Use No    History   Social History  . Marital Status: Married    Spouse Name: N/A  . Number of Children: N/A  . Years of Education: N/A   Social History Main Topics  . Smoking status: Never Smoker   . Smokeless tobacco: Not on file  . Alcohol Use: No  . Drug Use: No  . Sexual Activity: Not on file   Other Topics Concern  . None   Social History Narrative    Sleep: Good 7 h  Appetite:  Good    Musculoskeletal: Strength & Muscle Tone: within normal limits Gait & Station: normal Patient leans: N/A   Psychiatric Specialty Exam: Physical Exam  Nursing note and vitals reviewed. Constitutional: She appears well-developed.  HENT:  Head: Atraumatic.  Psychiatric: Her affect is angry and blunt. Her speech is delayed. She is withdrawn. Thought content is paranoid and delusional. Cognition and memory are impaired. She expresses inappropriate judgment.    Review of Systems  Constitutional: Negative.   HENT: Positive for congestion.   Eyes: Negative.   Respiratory: Negative.   Cardiovascular: Negative.   Gastrointestinal: Positive for constipation. Negative for heartburn.  Musculoskeletal: Negative.   Skin: Negative.   Neurological: Negative.   Psychiatric/Behavioral: Negative for depression, suicidal ideas, hallucinations, memory loss and substance abuse. The patient is not nervous/anxious and does not have insomnia.     Blood pressure 123/87, pulse 122, temperature 98 F (36.7 C), temperature source Oral, resp. rate 18, height 5\' 4"  (1.626 m),  weight 79.379 kg (175 lb), SpO2 100 %.Body mass index is 30.02 kg/(m^2).  General Appearance: Bizarre  Eye Contact::  Fair  Speech:  Normal Rate  Volume:  Decreased  Mood:  Good  Affect:  Flat   Thought Process:  Disorganized  Orientation:  Full (Time, Place, and Person)  Thought Content:  Delusions  Suicidal Thoughts:  No  Homicidal Thoughts:  No  Memory:  Immediate;   Good Recent;   Fair Remote;   Poor  Judgement:  Impaired  Insight:  Lacking  Psychomotor Activity:  Decreased  Concentration:  Poor  Recall:  Poor  Fund of Knowledge:Poor  Language: Poor  Akathisia:  No  Handed:  Right  AIMS (if indicated):     Assets:  Communication Skills Physical Health  ADL's:  Intact  Cognition: WNL  Sleep:  Number of Hours: 7     Current Medications: Current Facility-Administered Medications  Medication Dose Route Frequency Provider Last Rate Last Dose  . acetaminophen (TYLENOL) tablet 650 mg  650 mg Oral Q6H PRN Audery Amel, MD   650 mg at 10/04/14 0127  . alum & mag hydroxide-simeth (MAALOX/MYLANTA) 200-200-20 MG/5ML suspension 30 mL  30 mL Oral Q4H PRN  Audery Amel, MD   30 mL at 10/13/14 1007  . atenolol (TENORMIN) tablet 12.5 mg  12.5 mg Oral Daily Jimmy Footman, MD   12.5 mg at 10/15/14 1015  . loratadine (CLARITIN) tablet 10 mg  10 mg Oral Daily Jimmy Footman, MD   10 mg at 10/15/14 0917  . magnesium hydroxide (MILK OF MAGNESIA) suspension 30 mL  30 mL Oral Daily PRN Audery Amel, MD   30 mL at 10/06/14 0026  . risperiDONE (RISPERDAL M-TABS) disintegrating tablet 8 mg  8 mg Oral QHS Jimmy Footman, MD   8 mg at 10/15/14 2129    Lab Results: No results found for this or any previous visit (from the past 48 hour(s)).  Physical Findings: AIMS: Facial and Oral Movements Muscles of Facial Expression: None, normal Lips and Perioral Area: None, normal Jaw: None, normal Tongue: None, normal,Extremity Movements Upper (arms, wrists,  hands, fingers): None, normal Lower (legs, knees, ankles, toes): None, normal, Trunk Movements Neck, shoulders, hips: None, normal, Overall Severity Severity of abnormal movements (highest score from questions above): None, normal Incapacitation due to abnormal movements: None, normal Patient's awareness of abnormal movements (rate only patient's report): No Awareness, Dental Status Current problems with teeth and/or dentures?: No Does patient usually wear dentures?: No  CIWA:  CIWA-Ar Total: 0 COWS:     Treatment Plan Summary:  44 year old with  diagnosis of schizophrenia. Presented to our emergency department under petition due to bizarre behavior and paranoia. Patient is a poor historian at this time and there is no collateral information. Per records looks the patient was in our system backing 2002 for psychosis in [facility] emergency department.  Schizophrenia: I discussed with her the possibility of trying Invega instead of Risperdal as it is possible we can go higher on the dose of Invega without having as much sedation as it is seen with the higher dose of Risperdal. Also  she could benefit from invaga sustenna. She refused to change Risperdal to [place]. Therefore risperidone has been increased to 8 mg at bedtime.  Patient was very argumentative today, suspicious but somewhat less disorganized . She continues to deny having a mental illness and continues to have disorganized thought processes.  Hypertension: Continue atenolol 12.5 mg a day as patient is tachycardic  Labs: TSH and hemoglobin A1c and lipid panel were within the normal limits  Collateral information: Patient's son and ex-husband have been contacted. They are not close. The patient. They do not have any information about her. Her son is states that he was removed from her custody at a young age due to her issues with schizophrenia. The son stated the last he knew was that she was living somewhere in [place].     Precautions continue every 15 minute checks  Hospitalization status continue involuntary commitment  On waiting list for [facility]: Severe psychosocial stressors to prevent as from discharging her from this acute facility. This patient is very disorganized and unable to care for herself without supervision. She is in need of guardianship. Disability and placement. There is no family support or any other resources.  If discharged to a homeless shelter the patient is a likely to decompensate immediately.  Discharge planning: Patient was not opposed to discharge to a group home however per our social worker she does not receive Medicaid or disability. [facility] patient is not in their system and has not had receive any services by them. Social worker is to check with [facility]  Medical Decision Making:  Established Problem, Stable/Improving (1), Review of Medication Regimen & Side Effects (2) and Review of New Medication or Change in Dosage (2)     Jimmy Footman 10/16/2014, 9:42 AM

## 2014-10-16 NOTE — Plan of Care (Signed)
Problem: Alteration in thought process Goal: LTG-Patient behavior demonstrates decreased signs psychosis (Patient behavior demonstrates decreased signs of psychosis to the point the patient is safe to return home and continue treatment in an outpatient setting.)  Outcome: Progressing Thoughts noted clearing.

## 2014-10-16 NOTE — Clinical Social Work Note (Signed)
CSW went to patient room this morning and patient covered her head not wanting to talk as patient was paranoid and angry with CSW yesterday as her wristband from the hospital has incorrect info concerning her treatment team. Patient was up and out of bed throughout the day and attended groups.

## 2014-10-16 NOTE — Progress Notes (Signed)
D: Patient stated slept good last night .Stated appetite is good and energy level  Is normal. Stated concentration is good . Stated on Depression scale 1, hopeless 0 and anxiety 0 .( low 0-10 high) Denies suicidal  homicidal ideations  .  No auditory hallucinations  No pain concerns . Appropriate ADL'S. Interacting with peers and staff. Patient continue to focuse on her placement  Thoughts remain disorganized  A: Encourage patient participation with unit programming . Instruction  Given on  Medication , verbalize understanding. R: Voice no other concerns. Staff continue to monitor

## 2014-10-16 NOTE — [facility] Group Notes (Signed)
[facility] Group Notes:  (Nursing/MHT/Case Management/Adjunct)  Date:  10/16/2014  Time:  1:24 PM  Type of Therapy:  Psychoeducational Skills  Participation Level:  Did Not Attend   Lynelle Smoke [facility] 10/16/2014, 1:24 PM

## 2014-10-16 NOTE — [facility] Group Notes (Signed)
[facility] LCSW Group Therapy  10/16/2014 2:25 PM  Type of Therapy:  Group Therapy  Participation Level:  Active  Participation Quality:  Appropriate  Affect:  Appropriate  Cognitive:  Appropriate  Insight:  Developing/Improving  Engagement in Therapy:  Developing/Improving  Modes of Intervention:  Discussion, Education, Exploration and Support  Summary of Progress/Problems: Patient was able to identify the needs of others today and follow group. She was insightful.  [name], Roczen Waymire M 10/16/2014, 2:25 PM

## 2014-10-17 NOTE — [facility] Group Notes (Signed)
[facility] Group Notes:  (Nursing/MHT/Case Management/Adjunct)  Date:  10/17/2014  Time:  1:30 PM  Type of Therapy:  Movement Therapy  Participation Level:  Minimal  Participation Quality:  Appropriate  Affect:  Appropriate  Cognitive:  Alert, Appropriate and Oriented  Insight:  Improving  Engagement in Group:  Lacking  Modes of Intervention:  Activity  Summary of Progress/Problems:  Gloria Frank 10/17/2014, 1:30 PM

## 2014-10-17 NOTE — Progress Notes (Signed)
Recreation Therapy Notes  Date: 08.04.16 Time: 3:00 pm Location: Craft Room  Group Topic: Self-expression  Goal Area(s) Addresses:  Patient will indentify one color per emotion listed on wheel. Patient will verbalize benefit of using art as a means of self-expression. Patient will verbalize one emotion experienced during session. Patient will be educated on other forms of self-expression.  Behavioral Response: Did not attend  Intervention: Emotion Wheel  Activity: Patients were given an Emotion Wheel with 7 different emotions. Patients were instructed to pick a color for each emotion and color it in.  Education: LRT educated patients on different forms of self-expression.  Education Outcome: Patient did not attend group.  Clinical Observations/Feedback: Patient walked in group. LRT asked patient to wash hands. Patient questioned why. LRT explained that it was a rule for the craft room. Patient walked out of group and did not return to group.  Jacquelynn Cree, LRT/CTRS 10/17/2014 3:44 PM

## 2014-10-17 NOTE — Progress Notes (Signed)
D: Pt intrusive to staff and peers. Continues to address other peers with negative comments and questions. Pt upsetting to other patients. Pt continues to be argumentative with staff, will not answer questions. A: Encouraged pt to refrain from comments to other staff members and to attend groups.   R: Pt not easily redirectable, continues to be intrusive with staff and peers. Pt in argument with peer due to her asking if he were homosexual. Continued to redirect patient and discourage these interactions. Patients separated. Will continue to assess and monitor for safety.

## 2014-10-17 NOTE — Progress Notes (Signed)
D: Pt denies SI/HI/AVH. Pt is pleasant and cooperative with care, thought process is disorganized with some loose association noted, patient is less intrusive not arguementative, and complaint with medication. Patient is interacting with peers and staff appropriately.  A: Pt was offered support and encouragement. Pt was given scheduled medications. Pt was encouraged to attend groups. Q 15 minute checks were done for safety.  R:Pt attends groups and interacts well with peers and staff. Pt is taking medication. Pt has no complaints.Pt receptive to treatment and safety maintained on unit.

## 2014-10-17 NOTE — Plan of Care (Signed)
Problem: Alteration in thought process Goal: LTG-Patient behavior demonstrates decreased signs psychosis (Patient behavior demonstrates decreased signs of psychosis to the point the patient is safe to return home and continue treatment in an outpatient setting.)  Outcome: Progressing Able to follow simple commands, some disorganized thoughts noted, but some improvement noted over a couple of weeks.

## 2014-10-17 NOTE — [facility] Group Notes (Signed)
[facility] LCSW Group Therapy  10/17/2014 3:48 PM  Type of Therapy:  Group Therapy  Participation Level:  Minimal  Participation Quality:  Appropriate  Affect:  Appropriate  Cognitive:  Lacking  Insight:  Monopolizing  Engagement in Therapy:  Monopolizing  Modes of Intervention:  Confrontation  Summary of Progress/Problems:Patient was verbally aggressive towards another patient today. This patient left the group.  Arrie Senate M 10/17/2014, 3:48 PM

## 2014-10-17 NOTE — Progress Notes (Signed)
[facility] MD Progress Note  10/17/2014 11:10 AM CHARRON COULTAS  MRN:  [id number]   Subjective: Once again this morning patient was very suspicious and questions this writer constantly about my need to ask her for "issues". Not willing to answer any questions directly. Initially yesterday and today I agree with allowing [place] to charge her cell phone and get numbers from family members so we can perhaps arrange for her to be discharged to one of her relatives house.  When nurse attempted to get the phone out of the locker and charge it patient stated "you have no business charging of my telephone".  Nurse was forced to put the cell phone back in the local we were not able to obtain any phone numbers from her cell phone.   She was more argumentative and suspicious than yesterday.  She continues to be disorganized and is very hard to follow her train of thought most of her ideas are not well-connected. She denies having any hallucinations, suicidal ideation or homicidal ideation. She does complain of sedation with her medications but denies any other side effects. She denies major physical complaints today. Patient does not report having any issues with his sleep, appetite, energy or with concentration. Continues to be compliant with medication    Patient continues to walk around the unit wearing underwear on her head. Psychotic symptoms appear more florid when patient is confronted with information she dislikes such as issues related with medications and discharge plans to transfer her to the state facility.  This patient has no disability or Medicaid. There is no family in the area that is supportive even though she claims her children are supportive.  We have contacted the patient's ex husband  and one of her sons and they do not have any information about her or any contact with her on a regular basis.  Patient states she is a resident of [place] and at other times she is states she is going to move to  [place] with friends and with her children. However she is unable to provide any contact information for anybody neither and address.  At this point we do not have any ability to place her in a group home or to set her up with ACT team  services as she is uninsured.  Patient refuses to make any changes with her medications and is very reluctant to even increase in the dose of medications.   She is not an immediate threat to the safety of her peers or staff, there is no agitation, aggression in order to justify forcing injectable anti-psychotics.  She is willing to continue the treatment with the recommended dose of risperidone however this medication has only partially helped her.  This patient is in need of guardianship, disability and placement.  Due to her level of disorganization she will not be able to safely care for herself and not sure her stability. If discharged to a homeless shelter the patient is likely to decompensate immediately as is very likely she will not continue any of her anti-psychotics. She has refused to receive any long-acting injectable anti-psychotics. We have strongly feel that he is in the best interest of the patient to be transferred to a long-term care unit where all the social issues could be addressed.   Sleep is fair (5 h) and oral intake is fair. Patient made a bizarre statement as to why at times she skips meals. He was hard to follow but he was along the lines  that perhaps her children were not eating the same things so therefore she was not eating them.  Per nursing staff :  D: Pt denies SI/HI/AVH. Pt is pleasant and cooperative with care, thought process is disorganized with some loose association noted, patient is less intrusive not arguementative, and complaint with medication. Patient is interacting with peers and staff appropriately.  A: Pt was offered support and encouragement. Pt was given scheduled medications. Pt was encouraged to attend groups. Q 15 minute  checks were done for safety.  R:Pt attends groups and interacts well with peers and staff. Pt is taking medication. Pt has no complaints.Pt receptive to treatment and safety maintained on unit.  Principal Problem: Schizophrenia, unspecified type Diagnosis:   Patient Active Problem List   Diagnosis Date Noted  . Schizophrenia, unspecified type [F20.9] 09/24/2014  . High blood pressure [I10] 09/23/2014   Total Time spent with patient: 30 minutes   Past Medical History: History reviewed. No pertinent past medical history. History reviewed. No pertinent past surgical history. Family History: History reviewed. No pertinent family history. Social History:  History  Alcohol Use No     History  Drug Use No    History   Social History  . Marital Status: Married    Spouse Name: N/A  . Number of Children: N/A  . Years of Education: N/A   Social History Main Topics  . Smoking status: Never Smoker   . Smokeless tobacco: Not on file  . Alcohol Use: No  . Drug Use: No  . Sexual Activity: Not on file   Other Topics Concern  . None   Social History Narrative    Sleep: Fair 5 h  Appetite:  Good    Musculoskeletal: Strength & Muscle Tone: within normal limits Gait & Station: normal Patient leans: N/A   Psychiatric Specialty Exam: Physical Exam  Nursing note and vitals reviewed. Constitutional: She is oriented to person, place, and time. She appears well-developed.  HENT:  Head: Normocephalic and atraumatic.  Respiratory: Effort normal.  Musculoskeletal: Normal range of motion.  Neurological: She is alert and oriented to person, place, and time.  Psychiatric: She expresses inappropriate judgment.    Review of Systems  Constitutional: Negative.   HENT: Positive for congestion.   Eyes: Negative.   Respiratory: Negative.   Cardiovascular: Negative.   Gastrointestinal: Negative for heartburn.  Genitourinary: Negative.   Musculoskeletal: Negative.   Skin: Negative.    Neurological: Negative.   Psychiatric/Behavioral: Negative for depression, suicidal ideas, hallucinations, memory loss and substance abuse. The patient is not nervous/anxious and does not have insomnia.     Blood pressure 123/87, pulse 122, temperature 98 F (36.7 C), temperature source Oral, resp. rate 18, height 5\' 4"  (1.626 m), weight 79.379 kg (175 lb), SpO2 100 %.Body mass index is 30.02 kg/(m^2).  General Appearance: Bizarre  Eye Contact::  Fair  Speech:  Normal Rate  Volume:  Decreased  Mood:  Good  Affect:  Flat   Thought Process:  Disorganized  Orientation:  Full (Time, Place, and Person)  Thought Content:  Delusions  Suicidal Thoughts:  No  Homicidal Thoughts:  No  Memory:  Immediate;   Good Recent;   Fair Remote;   Poor  Judgement:  Impaired  Insight:  Lacking  Psychomotor Activity:  Decreased  Concentration:  Poor  Recall:  Poor  Fund of Knowledge:Poor  Language: Poor  Akathisia:  No  Handed:  Right  AIMS (if indicated):     Assets:  Communication Skills  Physical Health  ADL's:  Intact  Cognition: WNL  Sleep:  Number of Hours: 5     Current Medications: Current Facility-Administered Medications  Medication Dose Route Frequency Provider Last Rate Last Dose  . acetaminophen (TYLENOL) tablet 650 mg  650 mg Oral Q6H PRN Audery Amel, MD   650 mg at 10/04/14 0127  . alum & mag hydroxide-simeth (MAALOX/MYLANTA) 200-200-20 MG/5ML suspension 30 mL  30 mL Oral Q4H PRN Audery Amel, MD   30 mL at 10/13/14 1007  . atenolol (TENORMIN) tablet 12.5 mg  12.5 mg Oral Daily Jimmy Footman, MD   12.5 mg at 10/17/14 0906  . loratadine (CLARITIN) tablet 10 mg  10 mg Oral Daily Jimmy Footman, MD   10 mg at 10/17/14 0906  . magnesium hydroxide (MILK OF MAGNESIA) suspension 30 mL  30 mL Oral Daily PRN Audery Amel, MD   30 mL at 10/06/14 0026  . risperiDONE (RISPERDAL M-TABS) disintegrating tablet 8 mg  8 mg Oral QHS Jimmy Footman, MD   8  mg at 10/16/14 2148    Lab Results: No results found for this or any previous visit (from the past 48 hour(s)).  Physical Findings: AIMS: Facial and Oral Movements Muscles of Facial Expression: None, normal Lips and Perioral Area: None, normal Jaw: None, normal Tongue: None, normal,Extremity Movements Upper (arms, wrists, hands, fingers): None, normal Lower (legs, knees, ankles, toes): None, normal, Trunk Movements Neck, shoulders, hips: None, normal, Overall Severity Severity of abnormal movements (highest score from questions above): None, normal Incapacitation due to abnormal movements: None, normal Patient's awareness of abnormal movements (rate only patient's report): No Awareness, Dental Status Current problems with teeth and/or dentures?: No Does patient usually wear dentures?: No  CIWA:  CIWA-Ar Total: 0 COWS:     Treatment Plan Summary:  44 year old with  diagnosis of schizophrenia. Presented to our emergency department under petition due to bizarre behavior and paranoia. Patient is a poor historian at this time and there is no collateral information. Per records looks the patient was in our system backing 2002 for psychosis in [facility] emergency department.  Schizophrenia: I discussed with her the possibility of trying Invega instead of Risperdal as it is possible we can go higher on the dose of Invega without having as much sedation as it is seen with the higher dose of Risperdal. Also  she could benefit from invaga sustenna. She refused to change Risperdal to [place]. Therefore risperidone Mtab has been increased to 8 mg at bedtime.  Patient was very argumentative today, suspicious but somewhat less disorganized . She continues to deny having a mental illness and continues to have disorganized thought processes.  Hypertension: Continue atenolol 12.5 mg a day as patient is tachycardic  Labs: TSH and hemoglobin A1c and lipid panel were within the normal limits  Collateral  information: Patient's son and ex-husband have been contacted. They are not close. The patient. They do not have any information about her. Her son is states that he was removed from her custody at a young age due to her issues with schizophrenia. The son stated the last he knew was that she was living somewhere in [place].    Precautions continue every 15 minute checks  Hospitalization status continue involuntary commitment  On waiting list for [facility]: Severe psychosocial stressors to prevent as from discharging her from this acute facility. This patient is very disorganized and unable to care for herself without supervision. She is in need of guardianship.  Disability and placement. There is no family support or any other resources.  If discharged to a homeless shelter the patient is a likely to decompensate immediately.  Discharge planning: Patient was not opposed to discharge to a group home however per our social worker she does not receive Medicaid or disability. [facility] patient is not in their system and has not had receive any services by them. Social worker was also not able to find anything with [facility].  Both family members that we have contacted the patient was living somewhere in [place]. Most likely she was living in a homeless shelter in the area.    Medical Decision Making:  Established Problem, Stable/Improving (1), Review of Medication Regimen & Side Effects (2) and Review of New Medication or Change in Dosage (2)     Hernandez-Gonzalez,  Tangee Marszalek 10/17/2014, 11:10 AM

## 2014-10-17 NOTE — Plan of Care (Signed)
Problem: Ineffective individual coping Goal: STG: Patient will remain free from self harm Outcome: Progressing No self harm observed or reported Goal: STG:Pt. will utilize relaxation techniques to reduce stress STG: Patient will utilize relaxation techniques to reduce stress levels  Outcome: Not Progressing Pt argumentative with staff and peers

## 2014-10-17 NOTE — Plan of Care (Signed)
Problem: Ineffective individual coping Goal: LTG: Patient will report a decrease in negative feelings Outcome: Progressing Patient is labile, but for the most part states she feels better and is looking forward to discharge.  Goal: STG: Patient will remain free from self harm Outcome: Progressing No self harm.

## 2014-10-18 NOTE — Progress Notes (Signed)
[facility] MD Progress Note  10/18/2014 2:04 PM Gloria Frank  MRN:  [id number]   Subjective: This morning again patient was very disorganized. She says she wears underwear on her head because somebody told her that otherwise her womb would collapse.  We are unable to speak about discharge as she is not reality oriented. She says she could go to [place] to a place she knows will charge  $1200. When I explained to her that she has not funds to pay for that patients said "I'm pretty sure you guys can arrange for that". She was much calmer today and not as argumentative. She also appeared less  suspicious. She also may other bizarre statements during our conversation today.  Yesterday and today patient irritated one of her peers she has been following him around the unit asking him whether he is gay or not.  Today she provided me with the telephone number for her cousin, Crissie Sickles [phone]. Patient also told me today that she was released from the state hospital in the [place] area a couple of years ago.  Patient continues to walk around the unit wearing underwear on her head. Psychotic symptoms appear more florid when patient is confronted with information she dislikes such as issues related with medications and discharge plans to transfer her to the state facility.  This patient has no disability or Medicaid. There is no family in the area that is supportive even though she claims her children are supportive.  We have contacted the patient's ex husband  and one of her sons and they do not have any information about her or any contact with her on a regular basis.  Patient states she is a resident of [place] and at other times she is states she is going to move to [place] with friends and with her children. However she is unable to provide any contact information for anybody neither and address.  At this point we do not have any ability to place her in a group home or to set her up with ACT team   services as she is uninsured.  Patient refuses to make any changes with her medications and is very reluctant to even increase in the dose of medications.   She is not an immediate threat to the safety of her peers or staff, there is no agitation, aggression in order to justify forcing injectable anti-psychotics.  She is willing to continue the treatment with the recommended dose of risperidone however this medication has only partially helped her.  This patient is in need of guardianship, disability and placement.  Due to her level of disorganization she will not be able to safely care for herself and not sure her stability. If discharged to a homeless shelter the patient is likely to decompensate immediately as is very likely she will not continue any of her anti-psychotics. She has refused to receive any long-acting injectable anti-psychotics. We have strongly feel that he is in the best interest of the patient to be transferred to a long-term care unit where all the social issues could be addressed.   Sleep is good (6.5 h) and oral intake is fair.  Per nursing staff :  D: Pt intrusive to staff and peers. Continues to address other peers with negative comments and questions. Pt upsetting to other patients. Pt continues to be argumentative with staff, will not answer questions. A: Encouraged pt to refrain from comments to other staff members and to attend groups.   R:  Pt not easily redirectable, continues to be intrusive with staff and peers. Pt in argument with peer due to her asking if he were homosexual. Continued to redirect patient and discourage these interactions. Patients separated. Will continue to assess and monitor for safety.   Principal Problem: Schizophrenia, unspecified type Diagnosis:   Patient Active Problem List   Diagnosis Date Noted  . Schizophrenia, unspecified type [F20.9] 09/24/2014  . High blood pressure [I10] 09/23/2014   Total Time spent with patient: 30  minutes   Past Medical History: History reviewed. No pertinent past medical history. History reviewed. No pertinent past surgical history. Family History: History reviewed. No pertinent family history. Social History:  History  Alcohol Use No     History  Drug Use No    History   Social History  . Marital Status: Married    Spouse Name: N/A  . Number of Children: N/A  . Years of Education: N/A   Social History Main Topics  . Smoking status: Never Smoker   . Smokeless tobacco: Not on file  . Alcohol Use: No  . Drug Use: No  . Sexual Activity: Not on file   Other Topics Concern  . None   Social History Narrative    Sleep: Fair 5 h  Appetite:  Good    Musculoskeletal: Strength & Muscle Tone: within normal limits Gait & Station: normal Patient leans: N/A   Psychiatric Specialty Exam: Physical Exam  Nursing note and vitals reviewed. Constitutional: She is oriented to person, place, and time. She appears well-developed.  HENT:  Head: Normocephalic and atraumatic.  Respiratory: Effort normal.  Musculoskeletal: Normal range of motion.  Neurological: She is alert and oriented to person, place, and time.  Psychiatric: She expresses inappropriate judgment.    Review of Systems  Constitutional: Negative.   HENT: Positive for congestion.   Eyes: Negative.   Respiratory: Negative.   Cardiovascular: Negative.   Gastrointestinal: Negative for heartburn.  Genitourinary: Negative.   Musculoskeletal: Negative.   Skin: Negative.   Neurological: Negative.   Psychiatric/Behavioral: Negative for depression, suicidal ideas, hallucinations, memory loss and substance abuse. The patient is not nervous/anxious and does not have insomnia.     Blood pressure 119/79, pulse 89, temperature 98.4 F (36.9 C), temperature source Oral, resp. rate 18, height 5\' 4"  (1.626 m), weight 79.379 kg (175 lb), SpO2 100 %.Body mass index is 30.02 kg/(m^2).  General Appearance: Bizarre  Eye  Contact::  Fair  Speech:  Normal Rate  Volume:  Decreased  Mood:  Good  Affect:  Flat   Thought Process:  Disorganized  Orientation:  Full (Time, Place, and Person)  Thought Content:  Delusions  Suicidal Thoughts:  No  Homicidal Thoughts:  No  Memory:  Immediate;   Good Recent;   Fair Remote;   Poor  Judgement:  Impaired  Insight:  Lacking  Psychomotor Activity:  Decreased  Concentration:  Poor  Recall:  Poor  Fund of Knowledge:Poor  Language: Poor  Akathisia:  No  Handed:  Right  AIMS (if indicated):     Assets:  Communication Skills Physical Health  ADL's:  Intact  Cognition: WNL  Sleep:  Number of Hours: 6.5     Current Medications: Current Facility-Administered Medications  Medication Dose Route Frequency Provider Last Rate Last Dose  . acetaminophen (TYLENOL) tablet 650 mg  650 mg Oral Q6H PRN Audery Amel, MD   650 mg at 10/18/14 1103  . alum & mag hydroxide-simeth (MAALOX/MYLANTA) 200-200-20 MG/5ML suspension 30 mL  30 mL Oral Q4H PRN Audery Amel, MD   30 mL at 10/13/14 1007  . atenolol (TENORMIN) tablet 12.5 mg  12.5 mg Oral Daily Jimmy Footman, MD   12.5 mg at 10/18/14 0926  . loratadine (CLARITIN) tablet 10 mg  10 mg Oral Daily Jimmy Footman, MD   10 mg at 10/18/14 1191  . magnesium hydroxide (MILK OF MAGNESIA) suspension 30 mL  30 mL Oral Daily PRN Audery Amel, MD   30 mL at 10/06/14 0026  . risperiDONE (RISPERDAL M-TABS) disintegrating tablet 8 mg  8 mg Oral QHS Jimmy Footman, MD   8 mg at 10/17/14 2140    Lab Results: No results found for this or any previous visit (from the past 48 hour(s)).  Physical Findings: AIMS: Facial and Oral Movements Muscles of Facial Expression: None, normal Lips and Perioral Area: None, normal Jaw: None, normal Tongue: None, normal,Extremity Movements Upper (arms, wrists, hands, fingers): None, normal Lower (legs, knees, ankles, toes): None, normal, Trunk Movements Neck,  shoulders, hips: None, normal, Overall Severity Severity of abnormal movements (highest score from questions above): None, normal Incapacitation due to abnormal movements: None, normal Patient's awareness of abnormal movements (rate only patient's report): No Awareness, Dental Status Current problems with teeth and/or dentures?: No Does patient usually wear dentures?: No  CIWA:  CIWA-Ar Total: 0 COWS:     Treatment Plan Summary:  44 year old with  diagnosis of schizophrenia. Presented to our emergency department under petition due to bizarre behavior and paranoia. Patient is a poor historian at this time and there is no collateral information. Per records looks the patient was in our system backing 2002 for psychosis in [facility] emergency department.  Schizophrenia: I discussed with her the possibility of trying Invega instead of Risperdal as it is possible we can go higher on the dose of Invega without having as much sedation as it is seen with the higher dose of Risperdal. Also  she could benefit from invaga sustenna. She refused to change Risperdal to [place]. Therefore risperidone Mtab has been increased to 8 mg at bedtime.  Patient was very argumentative today, suspicious but somewhat less disorganized . She continues to deny having a mental illness and continues to have disorganized thought processes.  Hypertension: Continue atenolol 12.5 mg a day as patient is tachycardic  Labs: TSH and hemoglobin A1c and lipid panel were within the normal limits  Collateral information: Patient's son and ex-husband have been contacted. They are not close. The patient. They do not have any information about her. Her son is states that he was removed from her custody at a young age due to her issues with schizophrenia. The son stated the last he knew was that she was living somewhere in [place].    Precautions continue every 15 minute checks  Hospitalization status continue involuntary  commitment  On waiting list for [facility]: Severe psychosocial stressors to prevent as from discharging her from this acute facility. This patient is very disorganized and unable to care for herself without supervision. She is in need of guardianship. Disability and placement. There is no family support or any other resources.  If discharged to a homeless shelter the patient is a likely to decompensate immediately.  Discharge planning: Patient was not opposed to discharge to a group home however per our social worker she does not receive Medicaid or disability. [facility] patient is not in their system and has not had receive any services by them. Social worker was  also not able to find anything with [facility].  Both family members that we have contacted the patient was living somewhere in [place]. Most likely she was living in a homeless shelter in the area.    Medical Decision Making:  Established Problem, Stable/Improving (1), Review of Medication Regimen & Side Effects (2) and Review of New Medication or Change in Dosage (2)     Hernandez-Gonzalez,  Alexi Dorminey 10/18/2014, 2:04 PM

## 2014-10-18 NOTE — Progress Notes (Signed)
D: Pt continues to pace floor. Not as intrusive today. C/o headache, prn given with good relief.  A: Encouragement and support offered.  R: Pt receptive, and remains safe on unit q 15 min checks

## 2014-10-18 NOTE — Progress Notes (Signed)
Recreation Therapy Notes  Date: 08.05.16 Time: 3:00 pm Location: Craft Room  Group Topic: Coping Skills  Goal Area(s) Addresses:  Patient will verbalize an emotion they experienced during group. Patient will verbalize benefit of art.  Behavioral Response: Did not attend  Intervention: [place]  Activity: Patients were given mandalas to color.  Education: LRT educated patients on the benefits of art.  Education Outcome: Patient did not attend group.  Clinical Observations/Feedback: Patient did not attend group.  Ndea Kilroy M, LRT/CTRS 10/18/2014 4:30 PM 

## 2014-10-18 NOTE — [facility] Group Notes (Signed)
[facility] Group Notes:  (Nursing/MHT/Case Management/Adjunct)  Date:  10/18/2014  Time:  1:48 AM  Type of Therapy:  Group Therapy  Participation Level:  Active  Participation Quality:  Appropriate  Affect:  Appropriate  Cognitive:  Appropriate  Insight:  Appropriate  Engagement in Group:  Engaged  Modes of Intervention:  Discussion  Summary of Progress/Problems:  Law Corsino Joy Cambelle Suchecki 10/18/2014, 1:48 AM

## 2014-10-18 NOTE — Plan of Care (Signed)
Problem: Ineffective individual coping Goal: STG: Patient will remain free from self harm Outcome: Progressing No self harm reported or observed     

## 2014-10-18 NOTE — [facility] Group Notes (Signed)
[facility] LCSW Aftercare Discharge Planning Group Note   10/18/2014 11:49 AM  Participation Quality:  Minimal   Mood/Affect:  Flat  Depression Rating:  0  Anxiety Rating:  0  Thoughts of Suicide:  No Will you contract for safety?   NA  Current AVH:  No  Plan for Discharge/Comments:  Pt states she doing well and wanted to know when she will be discharged. Towards the end of group, she began talking to another patient across the room. She was asked to stop therefore she became upset at left group. Pt is still on [facility] waitlist.   Transportation Means: Public transportation.   Supports: None   Lowry Bala [employer] MSW, [address]

## 2014-10-18 NOTE — [facility] Group Notes (Signed)
[facility] Group Notes:  (Nursing/MHT/Case Management/Adjunct)  Date:  10/18/2014  Time:  4:33 PM  Type of Therapy:  Group Therapy  Participation Level:  Did Not Attend  Summary of Progress/Problems:  Abir Craine De'Chelle Jelicia Nantz 10/18/2014, 4:33 PM

## 2014-10-19 LAB — COMPREHENSIVE METABOLIC PANEL
ALBUMIN: 3.7 g/dL (ref 3.5–5.0)
ALT: 10 U/L — ABNORMAL LOW (ref 14–54)
AST: 17 U/L (ref 15–41)
Alkaline Phosphatase: 67 U/L (ref 38–126)
Anion gap: 7 (ref 5–15)
BILIRUBIN TOTAL: 0.3 mg/dL (ref 0.3–1.2)
BUN: 8 mg/dL (ref 6–20)
CO2: 30 mmol/L (ref 22–32)
Calcium: 8.7 mg/dL — ABNORMAL LOW (ref 8.9–10.3)
Chloride: 105 mmol/L (ref 101–111)
Creatinine, Ser: 0.58 mg/dL (ref 0.44–1.00)
GFR calc Af Amer: >60 mL/min (ref >60)
GFR calc non Af Amer: >60 mL/min (ref >60)
Glucose, Bld: 86 mg/dL (ref 65–99)
Potassium: 3.7 mmol/L (ref 3.5–5.1)
Sodium: 142 mmol/L (ref 135–145)
Total Protein: 7 g/dL (ref 6.5–8.1)

## 2014-10-19 LAB — CBC
HEMATOCRIT: 33.7 % — AB (ref 35.0–47.0)
Hemoglobin: 11.2 g/dL — ABNORMAL LOW (ref 12.0–16.0)
MCH: 31.2 pg (ref 26.0–34.0)
MCHC: 33.3 g/dL (ref 32.0–36.0)
MCV: 93.6 fL (ref 80.0–100.0)
Platelets: 360 10*3/uL (ref 150–440)
RBC: 3.6 MIL/uL — ABNORMAL LOW (ref 3.80–5.20)
RDW: 14.4 % (ref 11.5–14.5)
WBC: 4.9 10*3/uL (ref 3.6–11.0)

## 2014-10-19 LAB — VITAMIN B12: VITAMIN B 12: 506 pg/mL (ref 180–914)

## 2014-10-19 MED ORDER — CLONAZEPAM 0.5 MG PO TABS
0.5000 mg | ORAL_TABLET | Freq: Every day | ORAL | Status: DC
  Filled 2014-10-19: qty 1

## 2014-10-19 NOTE — [facility] Group Notes (Signed)
[facility] LCSW Group Therapy  10/19/2014 2:31 PM  Type of Therapy:  Group Therapy  Participation Level:  Did Not Attend  Modes of Intervention:  Discussion, Education, Socialization and Support  Summary of Progress/Problems: Todays topic: Grudges  Patients will be encouraged to discuss their thoughts, feelings, and behaviors as to why one holds on to grudges and reasons why people have grudges. Patients will process the impact of grudges on their daily lives and identify thoughts and feelings related to holding grudges. Patients will identify feelings and thoughts related to what life would look like without grudges.   Gloria Frank L [name] MSW, LCSWA  10/19/2014, 2:31 PM  

## 2014-10-19 NOTE — Progress Notes (Signed)
[facility] MD Progress Note  10/19/2014 10:00 AM Gloria Frank  MRN:  [id number]   Subjective: This morning again patient was less argumentative, calmer and pleasant. Still disorganized. And at times made bizarre statements.  She told me today she was living in an apartment in [place] [facility] but wasn't sure of the street address. When I asked her how she was paying for her rent patient was unable to answer as she made a bizarre statement and became somewhat suspicious. Patient it's okay with the plan of going to the state hospital as she told me a few days ago [place]    Patient denies SI, HI or auditory or visual hallucinations. She denies problems with mood, appetite, energy or concentration. She denies problems with sleep.  Yesterday and today patient irritated one of her peers she has been following him around the unit asking him whether he is gay or not.  Yesterday she provided me with the telephone number for her cousin, Crissie Sickles [phone]. Patient also told me today that she was released from the state hospital in the [place] area a couple of years ago.  Patient continues to walk around the unit wearing underwear on her head. Psychotic symptoms appear more florid when patient is confronted with information she dislikes such as issues related with medications and discharge plans to transfer her to the state facility.  This patient has no disability or Medicaid. There is no family in the area that is supportive even though she claims her children are supportive.  We have contacted the patient's ex husband  and one of her sons and they do not have any information about her or any contact with her on a regular basis.  Patient states she is a resident of [place] and at other times she is states she is going to move to [place] with friends and with her children. However she is unable to provide any  contact information for anybody neither and address.  At this point we do not have any ability to place her in a group home or to set her up with ACT team  services as she is uninsured.  Patient refuses to make any changes with her medications and is very reluctant to even increase in the dose of medications.   She is not an immediate threat to the safety of her peers or staff, there is no agitation, aggression in order to justify forcing injectable anti-psychotics.  She is willing to continue the treatment with the recommended dose of risperidone however this medication has only partially helped her.  This patient is in need of guardianship, disability and placement.  Due to her level of disorganization she will not be able to safely care for herself and not sure her stability. If discharged to a homeless shelter the patient is likely to decompensate immediately as is very likely she will not continue any of her anti-psychotics. She has refused to receive any long-acting injectable anti-psychotics. We have strongly feel that he is in the best interest of the patient to be transferred to a long-term care unit where all the social issues could be addressed.   Sleep is poor (6.5 h) and oral intake is fair.  Per nursing staff : Patient is minimally intrusive, and pleasant when addressed. She is focused on living in the [place] of [place]. She notes she was there before for heat  exhaustion. She notes she has family there and moving may be a possibility for her. She wants the social worker to know she is interested in exploring housing, knocking on the nurse's station door twice to specifically make sure a note was left to that effect. She denies SI, HI, and AVH. She is interactive with her peers and med compliant. No distress or needs are noted.     Principal Problem: Schizophrenia, unspecified type Diagnosis:   Patient Active Problem List   Diagnosis Date Noted  . Schizophrenia, unspecified type [F20.9]  09/24/2014  . High blood pressure [I10] 09/23/2014   Total Time spent with patient: 30 minutes   Past Medical History: History reviewed. No pertinent past medical history. History reviewed. No pertinent past surgical history. Family History: History reviewed. No pertinent family history. Social History:  History  Alcohol Use No     History  Drug Use No    History   Social History  . Marital Status: Married    Spouse Name: N/A  . Number of Children: N/A  . Years of Education: N/A   Social History Main Topics  . Smoking status: Never Smoker   . Smokeless tobacco: Not on file  . Alcohol Use: No  . Drug Use: No  . Sexual Activity: Not on file   Other Topics Concern  . None   Social History Narrative    Sleep: Poor   Appetite:  Good    Musculoskeletal: Strength & Muscle Tone: within normal limits Gait & Station: normal Patient leans: N/A   Psychiatric Specialty Exam: Physical Exam  Nursing note and vitals reviewed. Constitutional: She is oriented to person, place, and time. She appears well-developed.  HENT:  Head: Normocephalic and atraumatic.  Respiratory: Effort normal.  Musculoskeletal: Normal range of motion.  Neurological: She is alert and oriented to person, place, and time.  Psychiatric: She expresses inappropriate judgment.    Review of Systems  Constitutional: Negative.   HENT: Positive for congestion.   Eyes: Negative.   Respiratory: Negative.   Cardiovascular: Negative.   Gastrointestinal: Negative.  Negative for heartburn.  Genitourinary: Negative.   Musculoskeletal: Negative.   Skin: Negative.   Neurological: Negative.   Endo/Heme/Allergies: Negative.   Psychiatric/Behavioral: Negative for depression, suicidal ideas, hallucinations, memory loss and substance abuse. The patient is not nervous/anxious and does not have insomnia.     Blood pressure 119/79, pulse 89, temperature 98.4 F (36.9 C), temperature source Oral, resp. rate 18,  height 5\' 4"  (1.626 m), weight 79.379 kg (175 lb), SpO2 100 %.Body mass index is 30.02 kg/(m^2).  General Appearance: Bizarre  Eye Contact::  Fair  Speech:  Normal Rate  Volume:  Decreased  Mood:  Good  Affect:  Flat   Thought Process:  Disorganized  Orientation:  Full (Time, Place, and Person)  Thought Content:  Delusions  Suicidal Thoughts:  No  Homicidal Thoughts:  No  Memory:  Immediate;   Good Recent;   Fair Remote;   Poor  Judgement:  Impaired  Insight:  Lacking  Psychomotor Activity:  Decreased  Concentration:  Poor  Recall:  Poor  Fund of Knowledge:Poor  Language: Poor  Akathisia:  No  Handed:  Right  AIMS (if indicated):     Assets:  Communication Skills Physical Health  ADL's:  Intact  Cognition: WNL  Sleep:  Number of Hours: 3.75     Current Medications: Current Facility-Administered Medications  Medication Dose Route Frequency Provider Last Rate Last Dose  . acetaminophen (TYLENOL) tablet  650 mg  650 mg Oral Q6H PRN Audery Amel, MD   650 mg at 10/18/14 1103  . alum & mag hydroxide-simeth (MAALOX/MYLANTA) 200-200-20 MG/5ML suspension 30 mL  30 mL Oral Q4H PRN Audery Amel, MD   30 mL at 10/13/14 1007  . atenolol (TENORMIN) tablet 12.5 mg  12.5 mg Oral Daily Jimmy Footman, MD   12.5 mg at 10/19/14 0854  . loratadine (CLARITIN) tablet 10 mg  10 mg Oral Daily Jimmy Footman, MD   10 mg at 10/19/14 0854  . magnesium hydroxide (MILK OF MAGNESIA) suspension 30 mL  30 mL Oral Daily PRN Audery Amel, MD   30 mL at 10/06/14 0026  . risperiDONE (RISPERDAL M-TABS) disintegrating tablet 8 mg  8 mg Oral QHS Jimmy Footman, MD   8 mg at 10/18/14 2142    Lab Results: No results found for this or any previous visit (from the past 48 hour(s)).  Physical Findings: AIMS: Facial and Oral Movements Muscles of Facial Expression: None, normal Lips and Perioral Area: None, normal Jaw: None, normal Tongue: None, normal,Extremity  Movements Upper (arms, wrists, hands, fingers): None, normal Lower (legs, knees, ankles, toes): None, normal, Trunk Movements Neck, shoulders, hips: None, normal, Overall Severity Severity of abnormal movements (highest score from questions above): None, normal Incapacitation due to abnormal movements: None, normal Patient's awareness of abnormal movements (rate only patient's report): No Awareness, Dental Status Current problems with teeth and/or dentures?: No Does patient usually wear dentures?: No  CIWA:  CIWA-Ar Total: 0 COWS:     Treatment Plan Summary:  44 year old with  diagnosis of schizophrenia. Presented to our emergency department under petition due to bizarre behavior and paranoia. Patient is a poor historian at this time and there is no collateral information. Per records looks the patient was in our system backing 2002 for psychosis in [facility] emergency department.  Schizophrenia: I discussed with her the possibility of trying Invega instead of Risperdal as it is possible we can go higher on the dose of Invega without having as much sedation as it is seen with the higher dose of Risperdal. Also  she could benefit from invaga sustenna. She refused to change Risperdal to [place]. Therefore risperidone Mtab has been increased to 8 mg at bedtime.  Patient was very argumentative today, suspicious but somewhat less disorganized . She continues to deny having a mental illness and continues to have disorganized thought processes.  Insomnia: I will start the patient on a low dose of clonazepam as she's been sleeping fair to poor. Most of the night she sleeps only 5 hours yesterday she slept 3 hours.   Hypertension: Continue atenolol 12.5 mg a day as patient is tachycardic.  Heart rate is much improved since started on atenolol  Labs: TSH and hemoglobin A1c and lipid panel were within the normal limits.  I will order CBC, comprehensive metabolic panel, HIV, RPR and UA as patient states  that she will allow me to draw some blood today.  Collateral information: Patient's son and ex-husband have been contacted. They are not close. The patient. They do not have any information about her. Her son is states that he was removed from her custody at a young age due to her issues with schizophrenia. The son stated the last he knew was that she was living somewhere in [place].    Precautions continue every 15 minute checks  Hospitalization status continue involuntary commitment  On waiting list for [facility]: Severe psychosocial  stressors to prevent as from discharging her from this acute facility. This patient is very disorganized and unable to care for herself without supervision. She is in need of guardianship. Disability and placement. There is no family support or any other resources.  If discharged to a homeless shelter the patient is a likely to decompensate immediately.  Discharge planning: Patient was not opposed to discharge to a group home however per our social worker she does not receive Medicaid or disability. [facility] patient is not in their system and has not had receive any services by them. Social worker was also not able to find anything with [facility].  Both family members that we have contacted the patient was living somewhere in [place]. Most likely she was living in a homeless shelter in the area.    Medical Decision Making:  Established Problem, Stable/Improving (1), Review of Medication Regimen & Side Effects (2) and Review of New Medication or Change in Dosage (2)     Hernandez-Gonzalez,  Zenovia Justman 10/19/2014, 10:00 AM

## 2014-10-19 NOTE — Progress Notes (Signed)
Pt continues to be politely intrusive. Would not get up for lab to draw blood. Encouraged patient to comply with doctors orders. Pt later returned and requested lab come and draw blood. Did not attend group. Will continue to assess and monitor for safety.

## 2014-10-19 NOTE — Plan of Care (Signed)
Problem: Ineffective individual coping Goal: STG: Patient will remain free from self harm Outcome: Progressing No self harm noted or reported  Problem: Alteration in thought process Goal: STG-Patient does not respond to command hallucinations Outcome: Progressing Not responding to hallucinations

## 2014-10-19 NOTE — Progress Notes (Signed)
Patient is minimally intrusive, and pleasant when addressed. She is focused on living in the [place] of [place]. She notes she was there before for heat exhaustion. She notes she has family there and moving may be a possibility for her. She wants the social worker to know she is interested in exploring housing, knocking on the nurse's station door twice to specifically make sure a note was left to that effect. She denies SI, HI, and AVH. She is interactive with her peers and med compliant. No distress or needs are noted.

## 2014-10-19 NOTE — Plan of Care (Signed)
Problem: Ineffective individual coping Goal: STG: Patient will remain free from self harm Outcome: Progressing No self harm.   Problem: Alteration in thought process Goal: LTG-Patient behavior demonstrates decreased signs psychosis (Patient behavior demonstrates decreased signs of psychosis to the point the patient is safe to return home and continue treatment in an outpatient setting.)  Outcome: Progressing Patient is pleasant and med compliant, stating she understands the importance of medication.  Goal: LTG-Patient is able to perceive the environment accurately Outcome: Progressing Patient is not seen responding to internal stimuli, and is able to dialog with this Clinical research associate appropriately.

## 2014-10-20 LAB — HIV ANTIBODY (ROUTINE TESTING W REFLEX): HIV Screen 4th Generation wRfx: NONREACTIVE

## 2014-10-20 LAB — RPR: RPR Ser Ql: NONREACTIVE

## 2014-10-20 LAB — PROLACTIN: Prolactin: 169.8 ng/mL — ABNORMAL HIGH (ref 4.8–23.3)

## 2014-10-20 MED ORDER — LORAZEPAM 1 MG PO TABS
1.0000 mg | ORAL_TABLET | Freq: Every day | ORAL | Status: DC
  Administered 2014-10-20: 1 mg via ORAL
  Filled 2014-10-20 (×2): qty 1

## 2014-10-20 NOTE — [facility] Group Notes (Signed)
[facility] Group Notes:  (Nursing/MHT/Case Management/Adjunct)  Date:  10/20/2014  Time:  3:12 AM  Type of Therapy:  Group Therapy  Participation Level:  Did Not Attend   Gloria Frank [name] 10/20/2014, 3:12 AM

## 2014-10-20 NOTE — [facility] Group Notes (Signed)
[facility] Group Notes:  (Nursing/MHT/Case Management/Adjunct)  Date:  10/20/2014  Time:  9:41 AM  Type of Therapy:  Psychoeducational Skills  Participation Level:  Did Not Attend   Summary of Progress/Problems:  Gloria Frank Gloria Frank 10/20/2014, 9:41 AM 

## 2014-10-20 NOTE — Progress Notes (Signed)
[facility] MD Progress Note  10/20/2014 9:13 AM Gloria Frank  MRN:  [id number]   Subjective: This morning patient met several questions that were bizarre about her income in [place] and her resources in [place]. Patient does not seem to understand that at this point in time there is no disability of medication in the state of [place] but patient things we can arrange for housing utilizing her income from another state when she is not even able to provide any contact information or an address another state.   patient is suspicious she thinks one of the social worker has found information about her resources and funding in [place] even though she's been told multiple times that we have not been able to find any information in our systems.   Patient denies SI, HI or auditory or visual hallucinations. She denies problems with mood, appetite, energy or concentration. She denies problems with sleep.  Patient continues to walk around the unit wearing underwear on her head. Psychotic symptoms appear more florid when patient is confronted with information she dislikes such as issues related with medications and discharge plans to transfer her to the state facility.  This patient has no disability or Medicaid. There is no family in the area that is supportive even though she claims her children are supportive.  We have contacted the patient's ex husband  and one of her sons and they do not have any information about her or any contact with her on a regular basis.  Patient states she is a resident of [place] and at other times she is states she is going to move to [place] with friends and with her children. However she is unable to provide any contact information for anybody neither and address.  At this point we do not have any ability to place her in a group home or to set her up with ACT team  services as she is uninsured.  Patient refuses to make any changes with her medications and is very  reluctant to even increase in the dose of medications.   She is not an immediate threat to the safety of her peers or staff, there is no agitation, aggression in order to justify forcing injectable anti-psychotics.  She is willing to continue the treatment with the recommended dose of risperidone however this medication has only partially helped her.  This patient is in need of guardianship, disability and placement.  Due to her level of disorganization she will not be able to safely care for herself and not sure her stability. If discharged to a homeless shelter the patient is likely to decompensate immediately as is very likely she will not continue any of her anti-psychotics. She has refused to receive any long-acting injectable anti-psychotics. We have strongly feel that he is in the best interest of the patient to be transferred to a long-term care unit where all the social issues could be addressed.   Sleep is poor (5 h) and oral intake is fair.  Per nursing staff : Pt continues to be politely intrusive. Would not get up for lab to draw blood. Encouraged patient to comply with doctors orders. Pt later returned and requested lab come and draw blood. Did not attend group. Will continue to assess and monitor for safety.    Principal Problem: Schizophrenia, unspecified type Diagnosis:   Patient Active Problem List   Diagnosis Date Noted  . Schizophrenia, unspecified type [F20.9] 09/24/2014  . High blood pressure [I10] 09/23/2014  Total Time spent with patient: 30 minutes   Past Medical History: History reviewed. No pertinent past medical history. History reviewed. No pertinent past surgical history. Family History: History reviewed. No pertinent family history. Social History:  History  Alcohol Use No     History  Drug Use No    History   Social History  . Marital Status: Married    Spouse Name: N/A  . Number of Children: N/A  . Years of Education: N/A   Social History Main  Topics  . Smoking status: Never Smoker   . Smokeless tobacco: Not on file  . Alcohol Use: No  . Drug Use: No  . Sexual Activity: Not on file   Other Topics Concern  . None   Social History Narrative    Sleep: Poor   Appetite:  Good    Musculoskeletal: Strength & Muscle Tone: within normal limits Gait & Station: normal Patient leans: N/A   Psychiatric Specialty Exam: Physical Exam  Nursing note and vitals reviewed. Constitutional: She is oriented to person, place, and time. She appears well-developed.  HENT:  Head: Normocephalic and atraumatic.  Respiratory: Effort normal.  Musculoskeletal: Normal range of motion.  Neurological: She is alert and oriented to person, place, and time.  Psychiatric: She expresses inappropriate judgment.    Review of Systems  Constitutional: Negative.   HENT: Positive for congestion.   Eyes: Negative.   Respiratory: Negative.   Cardiovascular: Negative.   Gastrointestinal: Negative.  Negative for heartburn.  Genitourinary: Negative.   Musculoskeletal: Negative.   Skin: Negative.   Neurological: Negative.   Endo/Heme/Allergies: Negative.   Psychiatric/Behavioral: Negative for depression, suicidal ideas, hallucinations, memory loss and substance abuse. The patient is not nervous/anxious and does not have insomnia.     Blood pressure 119/79, pulse 89, temperature 98.4 F (36.9 C), temperature source Oral, resp. rate 18, height _0  (1.626 m), weight 79.379 kg (175 lb), SpO2 100 %.Body mass index is 30.02 kg/(m^2).  General Appearance: Bizarre  Eye Contact::  Fair  Speech:  Normal Rate  Volume:  Decreased  Mood:  Good  Affect:  Flat   Thought Process:  Disorganized  Orientation:  Full (Time, Place, and Person)  Thought Content:  Delusions  Suicidal Thoughts:  No  Homicidal Thoughts:  No  Memory:  Immediate;   Good Recent;   Fair Remote;   Poor  Judgement:  Impaired  Insight:  Lacking  Psychomotor Activity:  Decreased   Concentration:  Poor  Recall:  Poor  Fund of Knowledge:Poor  Language: Poor  Akathisia:  No  Handed:  Right  AIMS (if indicated):     Assets:  Communication Skills Physical Health  ADL's:  Intact  Cognition: WNL  Sleep:  Number of Hours: 5.75     Current Medications: Current Facility-Administered Medications  Medication Dose Route Frequency Provider Last Rate Last Dose  . acetaminophen (TYLENOL) tablet 650 mg  650 mg Oral Q6H PRN Gonzella Lex, MD   650 mg at 10/20/14 0855  . alum & mag hydroxide-simeth (MAALOX/MYLANTA) 200-200-20 MG/5ML suspension 30 mL  30 mL Oral Q4H PRN Gonzella Lex, MD   30 mL at 10/13/14 1007  . atenolol (TENORMIN) tablet 12.5 mg  12.5 mg Oral Daily Hildred Priest, MD   12.5 mg at 10/19/14 0855  . clonazePAM (KLONOPIN) tablet 0.5 mg  0.5 mg Oral QHS Hildred Priest, MD   0.5 mg at 10/19/14 2214  . loratadine (CLARITIN) tablet 10 mg  10 mg Oral Daily  Hildred Priest, MD   10 mg at 10/20/14 0855  . magnesium hydroxide (MILK OF MAGNESIA) suspension 30 mL  30 mL Oral Daily PRN Gonzella Lex, MD   30 mL at 10/06/14 0026  . risperiDONE (RISPERDAL M-TABS) disintegrating tablet 8 mg  8 mg Oral QHS Hildred Priest, MD   8 mg at 10/19/14 2214    Lab Results:  Results for orders placed or performed during the hospital encounter of 09/23/14 (from the past 48 hour(s))  Vitamin B12     Status: None   Collection Time: 10/19/14  2:49 PM  Result Value Ref Range   Vitamin B-12 506 180 - 914 pg/mL    Comment: (NOTE) This assay is not validated for testing neonatal or myeloproliferative syndrome specimens for Vitamin B12 levels. Performed at [facility]   CBC     Status: Abnormal   Collection Time: 10/19/14  2:49 PM  Result Value Ref Range   WBC 4.9 3.6 - 11.0 K/uL   RBC 3.60 (L) 3.80 - 5.20 MIL/uL   Hemoglobin 11.2 (L) 12.0 - 16.0 g/dL   HCT 33.7 (L) 35.0 - 47.0 %   MCV 93.6 80.0 - 100.0 fL   MCH 31.2 26.0 - 34.0  pg   MCHC 33.3 32.0 - 36.0 g/dL   RDW 14.4 11.5 - 14.5 %   Platelets 360 150 - 440 K/uL  Comprehensive metabolic panel     Status: Abnormal   Collection Time: 10/19/14  2:49 PM  Result Value Ref Range   Sodium 142 135 - 145 mmol/L   Potassium 3.7 3.5 - 5.1 mmol/L   Chloride 105 101 - 111 mmol/L   CO2 30 22 - 32 mmol/L   Glucose, Bld 86 65 - 99 mg/dL   BUN 8 6 - 20 mg/dL   Creatinine, Ser 0.58 0.44 - 1.00 mg/dL   Calcium 8.7 (L) 8.9 - 10.3 mg/dL   Total Protein 7.0 6.5 - 8.1 g/dL   Albumin 3.7 3.5 - 5.0 g/dL   AST 17 15 - 41 U/L   ALT 10 (L) 14 - 54 U/L   Alkaline Phosphatase 67 38 - 126 U/L   Total Bilirubin 0.3 0.3 - 1.2 mg/dL   GFR calc non Af Amer >60 >60 mL/min   GFR calc Af Amer >60 >60 mL/min    Comment: (NOTE) The eGFR has been calculated using the CKD EPI equation. This calculation has not been validated in all clinical situations. eGFR's persistently <60 mL/min signify possible Chronic Kidney Disease.    Anion gap 7 5 - 15    Physical Findings: AIMS: Facial and Oral Movements Muscles of Facial Expression: None, normal Lips and Perioral Area: None, normal Jaw: None, normal Tongue: None, normal,Extremity Movements Upper (arms, wrists, hands, fingers): None, normal Lower (legs, knees, ankles, toes): None, normal, Trunk Movements Neck, shoulders, hips: None, normal, Overall Severity Severity of abnormal movements (highest score from questions above): None, normal Incapacitation due to abnormal movements: None, normal Patient's awareness of abnormal movements (rate only patient's report): No Awareness, Dental Status Current problems with teeth and/or dentures?: No Does patient usually wear dentures?: No  CIWA:  CIWA-Ar Total: 0 COWS:     Treatment Plan Summary:  44 year old with  diagnosis of schizophrenia. Presented to our emergency department under petition due to bizarre behavior and paranoia. Patient is a poor historian at this time and there is no  collateral information. Per records looks the patient was in our system backing  2002 for psychosis in [facility] emergency department.  Schizophrenia: I discussed with her the possibility of trying Invega instead of Risperdal as it is possible we can go higher on the dose of Invega without having as much sedation as it is seen with the higher dose of Risperdal. Also  she could benefit from invaga sustenna. She refused to change Risperdal to [place]. Therefore risperidone Mtab has been increased to 8 mg at bedtime.  Patient was very argumentative today, suspicious but somewhat less disorganized . She continues to deny having a mental illness and continues to have disorganized thought processes.  Insomnia: Patient has refused clonazepam. She says she has taken Ativan before and she will be okay time that medication.  Hypertension: Continue atenolol 12.5 mg a day as patient is tachycardic.  Heart rate is much improved since started on atenolol  Labs: TSH and hemoglobin A1c and lipid panel were within the normal limits.  I will order CBC, comprehensive metabolic panel, HIV, RPR and UA as patient states that she will allow me to draw some blood today.  Collateral information: Patient's son and ex-husband have been contacted. They are not close. The patient. They do not have any information about her. Her son is states that he was removed from her custody at a young age due to her issues with schizophrenia. The son stated the last he knew was that she was living somewhere in [place].    Precautions continue every 15 minute checks  Hospitalization status continue involuntary commitment  On waiting list for [facility]: Severe psychosocial stressors to prevent as from discharging her from this acute facility. This patient is very disorganized and unable to care for herself without supervision. She is in need of guardianship. Disability and placement. There is no family support or any other resources.  If discharged  to a homeless shelter the patient is a likely to decompensate immediately.  Discharge planning: Patient was not opposed to discharge to a group home however per our social worker she does not receive Medicaid or disability. [facility] patient is not in their system and has not had receive any services by them. Social worker was also not able to find anything with [facility].  Both family members that we have contacted the patient was living somewhere in [place]. Most likely she was living in a homeless shelter in the area.    Medical Decision Making:  Established Problem, Stable/Improving (1), Review of Medication Regimen & Side Effects (2) and Review of New Medication or Change in Dosage (2)     Hildred Priest 10/20/2014, 9:13 AM

## 2014-10-20 NOTE — Progress Notes (Signed)
LCSW asked this CSW to meet with Gloria Frank to get a signed consent. Patient after lengthy discussion and a few calls did agree to sign a consents form for her cousin Gloria Frank. Patient had a brief discussion and convinced pt to sign the consent. She would like to work on a treatment and discharge plan. LCSW and patient went on line to see if anything else- such as her psychiatrist in [place] could be found. She signed a consent for [facility]. Pt was able to remember all numbers correctly by memory.  LCSW spoke to Gloria Frank, he is going to connect with family to see if their is a family member who can let her reside with them. LCSW agreed to call him back. Called Gloria Frank [phone] Unable to reach it was busy. Left a detailed voice mail message. Called [facility] center. [phone]

## 2014-10-20 NOTE — Progress Notes (Signed)
Pt has been a little less intrusive and more redirectable . Pt has been focused on not wanting to go to [facility]. Pt's thoughts continues to be disorganized. Pt denies SI and A/V hallucinations. Pt has been compliant with taking her po meds. Pt has not been  Attending unit activities but has been going to the dayroom with very limited interactions with peers.Will encourage more participation in unit activities .

## 2014-10-20 NOTE — Progress Notes (Signed)
LCSW called Gloria Frank back. He explained that pt has no siblings. No living immediate family. 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 Her marriage ended and she took children and lived in hotels for a year until she couldn't care for her kids. CPS took children and she went to several hospitals in [place].  Her son Gloria Frank has no recollection of her, however she has a daughter ( name unknown) Gloria Frank will look her up and provide this SW information.   Gloria Frank was asked to direct any family with information to call us.   In present time patient is not welcome in any of her distant families homes as they are elderly and fragile. He mentioned that the patient did stay for long durations at [facility] on [facility]. They should have a lot of information on pt ( according to cousin). It has been verified also that she was allowed to stay in an aunts house in [place]  But once she went off medications she was escorted out by police. ( they too [place] police would have a lot of information on Gloria Frank) Her cousin warned that Gloria Frank can be very aggressive and violent when off her medications or feels you will harm her.  He is not familiar  With who treated her, and reported he only heard periodically from her.

## 2014-10-20 NOTE — Plan of Care (Signed)
Problem: Ineffective individual coping Goal: LTG: Patient will report a decrease in negative feelings Outcome: Not Progressing Patient challenging and intrusive in her interactions this evening.  Goal: STG: Pt will be able to identify effective and ineffective STG: Pt will be able to identify effective and ineffective coping patterns  Outcome: Not Progressing Patient unwilling or unable to discuss coping mechanisms, or the need for any.  Goal: STG: Patient will remain free from self harm Outcome: Progressing No self harm.

## 2014-10-20 NOTE — [facility] Group Notes (Signed)
[facility] LCSW Group Therapy  10/20/2014 12:46 PM  Type of Therapy:  Group Therapy  Participation Level:  Did Not Attend  Modes of Intervention:  Discussion, Education, Socialization and Support  Summary of Progress/Problems: Balance in life: Patients will discuss the concept of balance and how it looks and feels to be unbalanced. Pt will identify areas in their life that is unbalanced and ways to become more balanced.    Cavin Longman L Kaison Mcparland MSW, LCSWA  10/20/2014, 12:46 PM  

## 2014-10-21 NOTE — [facility] Group Notes (Signed)
[facility] LCSW Group Therapy  10/21/2014 2:27 PM  Type of Therapy:  Group Therapy  Participation Level:  Minimal  Participation Quality:  Inattentive  Affect:  Blunted  Cognitive:  Disorganized  Insight:  Lacking  Engagement in Therapy:  Developing/Improving  Modes of Intervention:  Discussion, Education, Socialization and Support  Summary of Progress/Problems:LCSW introduced group rules and discussed overcoming obstacles. Patient reported she really has nothing to say about this. She did not appear to be focused on discussion or listening to her peers. She got up and left group after 15 minutes. She was polite and calm.  Gloria Frank 10/21/2014, 2:27 PM

## 2014-10-21 NOTE — [facility] Group Notes (Signed)
[facility] LCSW Aftercare Discharge Planning Group Note  10/21/2014 10:35 AM  Participation Quality:  DID not attend  Affect:    Cognitive:    Insight:    Engagement in Group:    Modes of Intervention:    Summary of Progress/Problems:  Gloria Frank 10/21/2014, 10:35 AM

## 2014-10-21 NOTE — [facility] Group Notes (Signed)
[facility] Group Notes:  (Nursing/MHT/Case Management/Adjunct)  Date:  10/21/2014  Time:  2:48 PM  Type of Therapy:  Psychoeducational Skills  Participation Level:  Did Not Attend   Lynelle Smoke [facility] 10/21/2014, 2:48 PM

## 2014-10-21 NOTE — Progress Notes (Signed)
D: Patient denies SI/HI/AVH.  Patient affect is blunted and her mood is labile.  Patient did attend evening group. Patient visible on the milieu. No distress noted. A: Support and encouragement offered. Scheduled medications given to pt. Q 15 min checks continued for patient safety. R: Patient receptive. Patient remains safe on the unit.

## 2014-10-21 NOTE — [facility] Group Notes (Signed)
[facility] Group Notes:  (Nursing/MHT/Case Management/Adjunct)  Date:  10/21/2014  Time:  10:42 PM  Type of Therapy:  Evening Wrap-up Group/Outside Activity  Participation Level:  Active  Participation Quality:  Appropriate  Affect:  Appropriate  Cognitive:  Alert  Insight:  Improving  Engagement in Group:  Improving  Modes of Intervention:  Activity  Summary of Progress/Problems:  Gloria Frank 10/21/2014, 10:42 PM

## 2014-10-21 NOTE — Progress Notes (Signed)
[facility] MD Progress Note  10/21/2014 1:56 PM SHAKENA CALLARI  MRN:  [id number]   Subjective: patient slept well last night and she to Ativan 1 mg. The patient was calmer and pleasant today. She doesn't today is her birthday. Denied having any other issues or concerns. She just wants to know when she is going to be discharged or transferred to the state hospital. Patient said that she was living in a apartment in [place] but her lease is now over and she no longer has that apartment or a place to stay in the area.  Patient says she has a boyfriend who lives in supported housing in [place]. She says she has stated there before and has been hospitalized in the [place] in the past.  She  provided Child psychotherapist with her boyfriend's phone number yesterday.  Patient denies SI, HI or auditory or visual hallucinations. She denies problems with mood, appetite, energy or concentration. She denies problems with sleep.  Patient continues to walk around the unit wearing underwear on her head. Psychotic symptoms appear more florid when patient is confronted with information she dislikes such as issues related with medications and discharge plans to transfer her to the state facility.  This patient has no disability or Medicaid. There is no family in the area that is supportive even though she claims her children are supportive.  We have contacted the patient's ex husband  and one of her sons and they do not have any information about her or any contact with her on a regular basis.  Patient states she is a resident of [place] and at other times she is states she is going to move to [place] with friends and with her children. However she is unable to provide any contact information for anybody neither and address.  At this point we do not have any ability to place her in a group home or to set her up with ACT team  services as she is uninsured.  Patient refuses to make any changes with her medications and is very  reluctant to even increase in the dose of medications.   She is not an immediate threat to the safety of her peers or staff, there is no agitation, aggression in order to justify forcing injectable anti-psychotics.  She is willing to continue the treatment with the recommended dose of risperidone however this medication has only partially helped her.  This patient is in need of guardianship, disability and placement.  Due to her level of disorganization she will not be able to safely care for herself and not sure her stability. If discharged to a homeless shelter the patient is likely to decompensate immediately as is very likely she will not continue any of her anti-psychotics. She has refused to receive any long-acting injectable anti-psychotics. We have strongly feel that he is in the best interest of the patient to be transferred to a long-term care unit where all the social issues could be addressed.   Sleep is poor (7 h) and oral intake is fair.  Per nursing staff : Pt has been a little less intrusive and more redirectable . Pt has been focused on not wanting to go to [facility]. Pt's thoughts continues to be disorganized. Pt denies SI and A/V hallucinations. Pt has been compliant with taking her po meds. Pt has not been Attending unit activities but has been going to the dayroom with very limited interactions with peers.Will encourage more participation in unit activities .  Principal Problem: Schizophrenia, unspecified type Diagnosis:   Patient Active Problem List   Diagnosis Date Noted  . Schizophrenia, unspecified type [F20.9] 09/24/2014  . High blood pressure [I10] 09/23/2014   Total Time spent with patient: 30 minutes   Past Medical History: History reviewed. No pertinent past medical history. History reviewed. No pertinent past surgical history. Family History: History reviewed. No pertinent family history. Social History:  History  Alcohol Use No     History  Drug Use No     History   Social History  . Marital Status: Married    Spouse Name: N/A  . Number of Children: N/A  . Years of Education: N/A   Social History Main Topics  . Smoking status: Never Smoker   . Smokeless tobacco: Not on file  . Alcohol Use: No  . Drug Use: No  . Sexual Activity: Not on file   Other Topics Concern  . None   Social History Narrative    Sleep: Good   Appetite:  Good    Musculoskeletal: Strength & Muscle Tone: within normal limits Gait & Station: normal Patient leans: N/A   Psychiatric Specialty Exam: Physical Exam  Nursing note and vitals reviewed. Constitutional: She is oriented to person, place, and time. She appears well-developed.  HENT:  Head: Normocephalic and atraumatic.  Respiratory: Effort normal.  Musculoskeletal: Normal range of motion.  Neurological: She is alert and oriented to person, place, and time.  Psychiatric: She expresses inappropriate judgment.    Review of Systems  Constitutional: Negative.   HENT: Positive for congestion.   Eyes: Negative.   Respiratory: Negative.   Cardiovascular: Negative.   Gastrointestinal: Negative.  Negative for heartburn.  Genitourinary: Negative.   Musculoskeletal: Negative.   Skin: Negative.   Neurological: Negative.   Endo/Heme/Allergies: Negative.   Psychiatric/Behavioral: Negative for depression, suicidal ideas, hallucinations, memory loss and substance abuse. The patient is not nervous/anxious and does not have insomnia.     Blood pressure 119/79, pulse 89, temperature 98.4 F (36.9 C), temperature source Oral, resp. rate 18, height $RemoveBe'5\' 4"'HGWotrhEj$  (1.626 m), weight 79.379 kg (175 lb), SpO2 100 %.Body mass index is 30.02 kg/(m^2).  General Appearance: Bizarre  Eye Contact::  Fair  Speech:  Normal Rate  Volume:  Decreased  Mood:  Good  Affect:  Flat   Thought Process:  Disorganized  Orientation:  Full (Time, Place, and Person)  Thought Content:  Delusions  Suicidal Thoughts:  No  Homicidal  Thoughts:  No  Memory:  Immediate;   Good Recent;   Fair Remote;   Poor  Judgement:  Impaired  Insight:  Lacking  Psychomotor Activity:  Decreased  Concentration:  Poor  Recall:  Poor  Fund of Knowledge:Poor  Language: Poor  Akathisia:  No  Handed:  Right  AIMS (if indicated):     Assets:  Communication Skills Physical Health  ADL's:  Intact  Cognition: WNL  Sleep:  Number of Hours: 7     Current Medications: Current Facility-Administered Medications  Medication Dose Route Frequency Provider Last Rate Last Dose  . acetaminophen (TYLENOL) tablet 650 mg  650 mg Oral Q6H PRN Gonzella Lex, MD   650 mg at 10/20/14 2256  . alum & mag hydroxide-simeth (MAALOX/MYLANTA) 200-200-20 MG/5ML suspension 30 mL  30 mL Oral Q4H PRN Gonzella Lex, MD   30 mL at 10/13/14 1007  . atenolol (TENORMIN) tablet 12.5 mg  12.5 mg Oral Daily Hildred Priest, MD   12.5 mg at 10/20/14 0951  .  loratadine (CLARITIN) tablet 10 mg  10 mg Oral Daily Hildred Priest, MD   10 mg at 10/20/14 0855  . LORazepam (ATIVAN) tablet 1 mg  1 mg Oral QHS Hildred Priest, MD   1 mg at 10/20/14 2200  . magnesium hydroxide (MILK OF MAGNESIA) suspension 30 mL  30 mL Oral Daily PRN Gonzella Lex, MD   30 mL at 10/06/14 0026  . risperiDONE (RISPERDAL M-TABS) disintegrating tablet 8 mg  8 mg Oral QHS Hildred Priest, MD   8 mg at 10/20/14 2200    Lab Results:  Results for orders placed or performed during the hospital encounter of 09/23/14 (from the past 48 hour(s))  Vitamin B12     Status: None   Collection Time: 10/19/14  2:49 PM  Result Value Ref Range   Vitamin B-12 506 180 - 914 pg/mL    Comment: (NOTE) This assay is not validated for testing neonatal or myeloproliferative syndrome specimens for Vitamin B12 levels. Performed at [facility]   CBC     Status: Abnormal   Collection Time: 10/19/14  2:49 PM  Result Value Ref Range   WBC 4.9 3.6 - 11.0 K/uL   RBC 3.60 (L)  3.80 - 5.20 MIL/uL   Hemoglobin 11.2 (L) 12.0 - 16.0 g/dL   HCT 33.7 (L) 35.0 - 47.0 %   MCV 93.6 80.0 - 100.0 fL   MCH 31.2 26.0 - 34.0 pg   MCHC 33.3 32.0 - 36.0 g/dL   RDW 14.4 11.5 - 14.5 %   Platelets 360 150 - 440 K/uL  Comprehensive metabolic panel     Status: Abnormal   Collection Time: 10/19/14  2:49 PM  Result Value Ref Range   Sodium 142 135 - 145 mmol/L   Potassium 3.7 3.5 - 5.1 mmol/L   Chloride 105 101 - 111 mmol/L   CO2 30 22 - 32 mmol/L   Glucose, Bld 86 65 - 99 mg/dL   BUN 8 6 - 20 mg/dL   Creatinine, Ser 0.58 0.44 - 1.00 mg/dL   Calcium 8.7 (L) 8.9 - 10.3 mg/dL   Total Protein 7.0 6.5 - 8.1 g/dL   Albumin 3.7 3.5 - 5.0 g/dL   AST 17 15 - 41 U/L   ALT 10 (L) 14 - 54 U/L   Alkaline Phosphatase 67 38 - 126 U/L   Total Bilirubin 0.3 0.3 - 1.2 mg/dL   GFR calc non Af Amer >60 >60 mL/min   GFR calc Af Amer >60 >60 mL/min    Comment: (NOTE) The eGFR has been calculated using the CKD EPI equation. This calculation has not been validated in all clinical situations. eGFR's persistently <60 mL/min signify possible Chronic Kidney Disease.    Anion gap 7 5 - 15  Prolactin     Status: Abnormal   Collection Time: 10/19/14  2:49 PM  Result Value Ref Range   Prolactin 169.8 (H) 4.8 - 23.3 ng/mL    Comment: (NOTE) Performed At: [facility] [place], [place] [id number] Lindon Romp MD [id number]   HIV antibody     Status: None   Collection Time: 10/19/14  2:49 PM  Result Value Ref Range   HIV Screen 4th Generation wRfx Non Reactive Non Reactive    Comment: (NOTE) Performed At: [facility] [place], [place] [id number] Lindon Romp MD [id number]   RPR     Status: None   Collection Time: 10/19/14  2:49 PM  Result  Value Ref Range   RPR Ser Ql Non Reactive Non Reactive    Comment: (NOTE) Performed At: [facility] [place], [place] [id number] Lindon Romp MD [id number]      Physical Findings: AIMS: Facial and Oral Movements Muscles of Facial Expression: None, normal Lips and Perioral Area: None, normal Jaw: None, normal Tongue: None, normal,Extremity Movements Upper (arms, wrists, hands, fingers): None, normal Lower (legs, knees, ankles, toes): None, normal, Trunk Movements Neck, shoulders, hips: None, normal, Overall Severity Severity of abnormal movements (highest score from questions above): None, normal Incapacitation due to abnormal movements: None, normal Patient's awareness of abnormal movements (rate only patient's report): No Awareness, Dental Status Current problems with teeth and/or dentures?: No Does patient usually wear dentures?: No  CIWA:  CIWA-Ar Total: 0 COWS:     Treatment Plan Summary:  44 year old with  diagnosis of schizophrenia. Presented to our emergency department under petition due to bizarre behavior and paranoia. Patient is a poor historian at this time and there is no collateral information. Per records looks the patient was in our system backing 2002 for psychosis in [facility] emergency department.  Schizophrenia: I discussed with her the possibility of trying Invega instead of Risperdal as it is possible we can go higher on the dose of Invega without having as much sedation as it is seen with the higher dose of Risperdal. Also  she could benefit from invaga sustenna. She refused to change Risperdal to [place]. Therefore risperidone Mtab has been increased to 8 mg at bedtime.  Patient was very argumentative today, suspicious but somewhat less disorganized . She continues to deny having a mental illness and continues to have disorganized thought processes.  Insomnia: Patient has refused clonazepam. She says she has taken Ativan before and she will be okay time that medication.  Hypertension: Continue atenolol 12.5 mg a day as patient is tachycardic.  Heart rate is much improved since started on atenolol  Labs: TSH and  hemoglobin A1c and lipid panel were within the normal limits. HIV and RPR nonreactive. Vitamin B12 was within the normal limits. Compressive metabolic panel and CBC and basically within the normal limits. Prolactin level is 169. Patient appears to be a symptomatic however is difficult to verify this as she is very suspicious and uncooperative at times to questioning. Will attend to talk to her about prescribing amantadine in order to decrease prolactin.  Collateral information: Patient's son and ex-husband have been contacted. They are not close. The patient. They do not have any information about her. Her son is states that he was removed from her custody at a young age due to her issues with schizophrenia. The son stated the last he knew was that she was living somewhere in [place].    Precautions continue every 15 minute checks  Hospitalization status continue involuntary commitment  On waiting list for [facility]: Severe psychosocial stressors to prevent as from discharging her from this acute facility. This patient is very disorganized and unable to care for herself without supervision. She is in need of guardianship. Disability and placement. There is no family support or any other resources.  If discharged to a homeless shelter the patient is a likely to decompensate immediately.  Discharge planning: Patient was not opposed to discharge to a group home however per our social worker she does not receive Medicaid or disability. [facility] patient is not in their system and has not had receive any services by them. Social worker was  also not able to find anything with [facility].  Both family members that we have contacted the patient was living somewhere in [place]. Most likely she was living in a homeless shelter in the area.  Social worker contacted the patient's causing him Kerney Elbe. He reported that the patient has been missing for 3 months. He reports that she was  living in a apartment in [place]. It is unclear as to how she was paying for this apartment. The telephone number of another psychiatric facility has been provided but the patient appears to be a real place in [place]. Social worker will contact them today to see if they have any information about services    Medical Decision Making:  Established Problem, Stable/Improving (1), Review of Medication Regimen & Side Effects (2) and Review of New Medication or Change in Dosage (2)     Hildred Priest 10/21/2014, 1:56 PM

## 2014-10-21 NOTE — Progress Notes (Signed)
D: Patient got up late from bed this am . No unit programming . Appropriate ADL'S . Instructions  Writer on her birthday being today . Refused medications  This am shift  Limited  Interactions  With her peers  . Attempted to focus on her placement issues unable to fully  Understand  Or show insight to her placement. Appetite good  Remains suspicious an paranoid  A: Instructions on medications  And why she needs to get up in the am . R: Receptive to redirection . Remains calm

## 2014-10-21 NOTE — Progress Notes (Signed)
Recreation Therapy Notes  Date: 08.08.16 Time: 3:00 pm Location: Craft Room  Group Topic: Self-expression  Goal Area(s) Addresses:  Patient will be able to identify a color that represents each emotion. Patient will verbalize benefit of using art as a means of self-expression. Patient will verbalize one positive emotion experienced while participating in activity.  Behavioral Response: Did not attend  Intervention: The Colors Within Me  Activity: Patients were given a blank face worksheet and instructed to think of the emotions they were experiencing, pick a color for that emotion, and show how much of that emotion they are experiencing on the worksheet.   Education: LRT educated patients on different forms of self-expression.  Education Outcome: Patient did not attend group.  Clinical Observations/Feedback: Patient did not attend group.  Jacquelynn Cree, LRT/CTRS 10/21/2014 4:09 PM

## 2014-10-22 LAB — URINALYSIS COMPLETE WITH MICROSCOPIC (ARMC ONLY)
BACTERIA UA: NONE SEEN
Bilirubin Urine: NEGATIVE
Glucose, UA: NEGATIVE mg/dL
Hgb urine dipstick: NEGATIVE
KETONES UR: NEGATIVE mg/dL
Nitrite: NEGATIVE
Protein, ur: NEGATIVE mg/dL
SPECIFIC GRAVITY, URINE: 1.01 (ref 1.005–1.030)
pH: 6 (ref 5.0–8.0)

## 2014-10-22 MED ORDER — TUBERCULIN PPD 5 UNIT/0.1ML ID SOLN
5.0000 [IU] | Freq: Once | INTRADERMAL | Status: AC
  Administered 2014-10-22: 5 [IU] via INTRADERMAL
  Filled 2014-10-22: qty 0.1

## 2014-10-22 MED ORDER — SALINE SPRAY 0.65 % NA SOLN
1.0000 | NASAL | Status: DC | PRN
  Administered 2014-10-24 – 2014-10-26 (×3): 1 via NASAL
  Filled 2014-10-22: qty 44

## 2014-10-22 NOTE — Progress Notes (Signed)
D: Patient denies SI/HI/AVH.   Patient affect is blunted and her mood is suspicious.  Patient did attend evening group. Patient visible on the milieu. No distress noted. A: Support and encouragement offered. Scheduled medications given to pt. Q 15 min checks continued for patient safety. R: Patient receptive. Patient remains safe on the unit.

## 2014-10-22 NOTE — Clinical Social Work Note (Signed)
CSW answered phone call from [facility] Coordinator Gloria Frank who is now patient's care coordinator. CSW explained to Gloria Frank that information obtained from patient and her family is in pieces and hard to make a solid plan. Patient has been said by family to have lived in a shelter in [place] and CSW called women's shelter in [place] but beds are full. Patient was also said by family to have lived in an assisted living but no clear understanding of which facility patient lived in. When CSW questioned patient, she shared that she lived alone in [place] housing [address] apartment but could not give specific info about the apartment such as contact info. Patient is currently homeless and does not want to go to group home but needs a place to stay and is disorganized and continues to be psychotic. As per family, patient can become violent when off of her medications but has not during this hospitialization. Gloria Frank agreed to research past health and lving arrangements of patient in [place] area.

## 2014-10-22 NOTE — Progress Notes (Signed)
[facility] MD Progress Note  10/22/2014 7:53 AM Gloria Frank  MRN:  [id number]   Subjective: patient slept well last night and she to Ativan 1 mg. The patient was calmer and pleasant today. Denied having any other issues or concerns. She just wants to know when she is going to be discharged or transferred to the state hospital.   Patient denies SI, HI or auditory or visual hallucinations. She denies problems with mood, appetite, energy or concentration. She denies problems with sleep.  Patient continues to walk around the unit wearing underwear on her head. Psychotic symptoms appear more florid when patient is confronted with information she dislikes such as issues related with medications and discharge plans to transfer her to the state facility.  This patient has no disability or Medicaid. There is no family in the area that is supportive even though she claims her children are supportive.  We have contacted the patient's ex husband  and one of her sons and they do not have any information about her or any contact with her on a regular basis.  Patient states she is a resident of [place] and at other times she is states she is going to move to [place] with friends and with her children. However she is unable to provide any contact information for anybody neither and address.  At this point we do not have any ability to place her in a group home or to set her up with ACT team  services as she is uninsured.  Patient refuses to make any changes with her medications and is very reluctant to even increase in the dose of medications.   She is not an immediate threat to the safety of her peers or staff, there is no agitation, aggression in order to justify forcing injectable anti-psychotics.  She is willing to continue the treatment with the recommended dose of risperidone however this medication has only partially helped her.  This patient is in need of guardianship, disability and placement.  Due to her  level of disorganization she will not be able to safely care for herself and not sure her stability. If discharged to a homeless shelter the patient is likely to decompensate immediately as is very likely she will not continue any of her anti-psychotics. She has refused to receive any long-acting injectable anti-psychotics. We have strongly feel that he is in the best interest of the patient to be transferred to a long-term care unit where all the social issues could be addressed.   Sleep is poor (4.25h) and oral intake is fair.  Per nursing staff : D: Patient denies SI/HI/AVH. Patient affect is blunted and her mood is suspicious. Patient did attend evening group. Patient visible on the milieu. No distress noted. A: Support and encouragement offered. Scheduled medications given to pt. Q 15 min checks continued for patient safety. R: Patient receptive. Patient remains safe on the unit.      Principal Problem: Schizophrenia, unspecified type Diagnosis:   Patient Active Problem List   Diagnosis Date Noted  . Schizophrenia, unspecified type [F20.9] 09/24/2014  . High blood pressure [I10] 09/23/2014   Total Time spent with patient: 30 minutes   Past Medical History: History reviewed. No pertinent past medical history. History reviewed. No pertinent past surgical history. Family History: History reviewed. No pertinent family history. Social History:  History  Alcohol Use No     History  Drug Use No    History   Social History  . Marital  Status: Married    Spouse Name: N/A  . Number of Children: N/A  . Years of Education: N/A   Social History Main Topics  . Smoking status: Never Smoker   . Smokeless tobacco: Not on file  . Alcohol Use: No  . Drug Use: No  . Sexual Activity: Not on file   Other Topics Concern  . None   Social History Narrative    Sleep: Good   Appetite:  Good    Musculoskeletal: Strength & Muscle Tone: within normal limits Gait & Station:  normal Patient leans: N/A   Psychiatric Specialty Exam: Physical Exam  Nursing note and vitals reviewed. Constitutional: She is oriented to person, place, and time. She appears well-developed.  HENT:  Head: Normocephalic and atraumatic.  Respiratory: Effort normal.  Musculoskeletal: Normal range of motion.  Neurological: She is alert and oriented to person, place, and time.  Psychiatric: She expresses inappropriate judgment.    Review of Systems  Constitutional: Negative.   HENT: Positive for congestion.   Eyes: Negative.   Respiratory: Negative.   Cardiovascular: Negative.   Gastrointestinal: Negative.  Negative for heartburn.  Genitourinary: Negative.   Musculoskeletal: Negative.   Skin: Negative.   Neurological: Negative.   Endo/Heme/Allergies: Negative.   Psychiatric/Behavioral: Negative for depression, suicidal ideas, hallucinations, memory loss and substance abuse. The patient is not nervous/anxious and does not have insomnia.     Blood pressure 145/93, pulse 75, temperature 98.5 F (36.9 C), temperature source Oral, resp. rate 18, height 5\' 4"  (1.626 m), weight 79.379 kg (175 lb), SpO2 100 %.Body mass index is 30.02 kg/(m^2).  General Appearance: Bizarre  Eye Contact::  Fair  Speech:  Normal Rate  Volume:  Decreased  Mood:  Good  Affect:  Flat   Thought Process:  Disorganized  Orientation:  Full (Time, Place, and Person)  Thought Content:  Delusions  Suicidal Thoughts:  No  Homicidal Thoughts:  No  Memory:  Immediate;   Good Recent;   Fair Remote;   Poor  Judgement:  Impaired  Insight:  Lacking  Psychomotor Activity:  Decreased  Concentration:  Poor  Recall:  Poor  Fund of Knowledge:Poor  Language: Poor  Akathisia:  No  Handed:  Right  AIMS (if indicated):     Assets:  Communication Skills Physical Health  ADL's:  Intact  Cognition: WNL  Sleep:  Number of Hours: 4.25     Current Medications: Current Facility-Administered Medications  Medication  Dose Route Frequency Provider Last Rate Last Dose  . acetaminophen (TYLENOL) tablet 650 mg  650 mg Oral Q6H PRN Audery Amel, MD   650 mg at 10/20/14 2256  . alum & mag hydroxide-simeth (MAALOX/MYLANTA) 200-200-20 MG/5ML suspension 30 mL  30 mL Oral Q4H PRN Audery Amel, MD   30 mL at 10/13/14 1007  . atenolol (TENORMIN) tablet 12.5 mg  12.5 mg Oral Daily Jimmy Footman, MD   12.5 mg at 10/20/14 0951  . loratadine (CLARITIN) tablet 10 mg  10 mg Oral Daily Jimmy Footman, MD   10 mg at 10/20/14 0855  . LORazepam (ATIVAN) tablet 1 mg  1 mg Oral QHS Jimmy Footman, MD   1 mg at 10/20/14 2200  . magnesium hydroxide (MILK OF MAGNESIA) suspension 30 mL  30 mL Oral Daily PRN Audery Amel, MD   30 mL at 10/06/14 0026  . risperiDONE (RISPERDAL M-TABS) disintegrating tablet 8 mg  8 mg Oral QHS Jimmy Footman, MD   8 mg at 10/21/14 2143  Lab Results:  No results found for this or any previous visit (from the past 48 hour(s)).  Physical Findings: AIMS: Facial and Oral Movements Muscles of Facial Expression: None, normal Lips and Perioral Area: None, normal Jaw: None, normal Tongue: None, normal,Extremity Movements Upper (arms, wrists, hands, fingers): None, normal Lower (legs, knees, ankles, toes): None, normal, Trunk Movements Neck, shoulders, hips: None, normal, Overall Severity Severity of abnormal movements (highest score from questions above): None, normal Incapacitation due to abnormal movements: None, normal Patient's awareness of abnormal movements (rate only patient's report): No Awareness, Dental Status Current problems with teeth and/or dentures?: No Does patient usually wear dentures?: No  CIWA:  CIWA-Ar Total: 0 COWS:     Treatment Plan Summary:  44 year old with  diagnosis of schizophrenia. Presented to our emergency department under petition due to bizarre behavior and paranoia. Patient is a poor historian at this time and there  is no collateral information. Per records looks the patient was in our system backing 2002 for psychosis in [facility] emergency department.  Schizophrenia: I discussed with her the possibility of trying Invega instead of Risperdal as it is possible we can go higher on the dose of Invega without having as much sedation as it is seen with the higher dose of Risperdal. Also  she could benefit from invaga sustenna. She refused to change Risperdal to [place]. Therefore risperidone Mtab has been increased to 8 mg at bedtime.  Patient was very argumentative today, suspicious but somewhat less disorganized . She continues to deny having a mental illness and continues to have disorganized thought processes.  Insomnia: continue ativan 1 mg po qhs  Hypertension: Continue atenolol 12.5 mg a day as patient is tachycardic.  Heart rate is much improved since started on atenolol  Labs: TSH and hemoglobin A1c and lipid panel were within the normal limits. HIV and RPR nonreactive. Vitamin B12 was within the normal limits. Compressive metabolic panel and CBC and basically within the normal limits. Prolactin level is 169. Patient appears to be a symptomatic however is difficult to verify this as she is very suspicious and uncooperative at times to questioning. Will attend to talk to her about prescribing amantadine in order to decrease prolactin.  Collateral information: Patient's son and ex-husband have been contacted. They are not close. The patient. They do not have any information about her. Her son is states that he was removed from her custody at a young age due to her issues with schizophrenia. The son stated the last he knew was that she was living somewhere in [place].    Precautions continue every 15 minute checks  Hospitalization status continue involuntary commitment  On waiting list for [facility]: Severe psychosocial stressors to prevent as from discharging her from this acute facility. This patient is very  disorganized and unable to care for herself without supervision. She is in need of guardianship. Disability and placement. There is no family support or any other resources.  If discharged to a homeless shelter the patient is a likely to decompensate immediately.  Discharge planning: Patient was not opposed to discharge to a group home however per our social worker she does not receive Medicaid or disability. [facility] patient is not in their system and has not had receive any services by them. Social worker was also not able to find anything with [facility].  Both family members that we have contacted the patient was living somewhere in [place]. Most likely she was living in a  homeless shelter in the area.  Social worker contacted the patient's causing him [name]. He reported that the patient has been missing for 3 months. He reports that she was living in a apartment in [facility]. It is unclear as to how she was paying for this apartment. Today the case was discussed with him all social workers. We hope that we can start the process of applying for Medicaid and disability from here. They also stated that maybe they can write a letter of a guarantee have the patient discharged to a group home in her area with a referral for APS as she needs a guardian.  PPD will be ordered today.    Medical Decision Making:  Established Problem, Stable/Improving (1), Review of Medication Regimen & Side Effects (2) and Review of New Medication or Change in Dosage (2)     Jimmy Footman 10/22/2014, 7:53 AM

## 2014-10-22 NOTE — Plan of Care (Signed)
Problem: Alteration in thought process Goal: LTG-Patient verbalizes understanding importance med regimen (Patient verbalizes understanding of importance of medication regimen and need to continue outpatient care.)  Outcome: Progressing Patient agreeable to meds, including TB skin test. She questions what her pills are but is taking them.

## 2014-10-22 NOTE — [facility] Group Notes (Signed)
[facility] Group Notes:  (Nursing/MHT/Case Management/Adjunct)  Date:  10/22/2014  Time:  2:05 PM  Type of Therapy:  Psychoeducational Skills  Participation Level:  Did Not Attend  P  Summary of Progress/Problems:  Mickey Farber 10/22/2014, 2:05 PM

## 2014-10-22 NOTE — Progress Notes (Signed)
Recreation Therapy Notes  Date: 08.09.16 Time: 3:00 pm Location: Craft Room  Group Topic: Goal Setting  Goal Area(s) Addresses:  Patients will write at least one goal. Patients will write at least one obstacle.  Behavioral Response: Did not attend  Intervention: Recovery Goal Chart  Activity: Patients were instructed to make a Recovery Goal Chart listing goals, obstacles, the date they started working on their goal and the date they completed their goal.  Education: LRT educated patients on healthy ways to celebrate reaching their goals.   Education Outcome: Patient did not attend group.   Clinical Observations/Feedback: Patient did not attend group.  Iesha Summerhill M, LRT/CTRS 10/22/2014 4:07 PM 

## 2014-10-22 NOTE — Progress Notes (Signed)
Patient has had a labile mood today. She is upset to find out that staff is trying to assign a guardian to her. She states that she has known people who have had guardians, and they don't "let them have any decent clothes."  She voiced fears about losing control of her choices if she has a guardian. "Once you have one, you can't say you don't want them anymore. Patient denies SI/HI/AVH. Thoughts mostly goal-directed. Still wearing underwear on her head as a scarf.  Will continue to monitor.

## 2014-10-22 NOTE — Clinical Social Work Note (Signed)
CSW spoke with patient's closest relative, [name] [phone] and stated patient has spent most of her time the last year at the women's shelter in [place] on [address] but no other details. Thereasa Distance shared that patietn does not tell him personal details and has no idea of anyone that knows her better than him and he reports he does not know her income of disability.

## 2014-10-23 MED ORDER — NITROFURANTOIN MONOHYD MACRO 100 MG PO CAPS
100.0000 mg | ORAL_CAPSULE | Freq: Two times a day (BID) | ORAL | Status: AC
  Administered 2014-10-23 – 2014-10-27 (×10): 100 mg via ORAL
  Filled 2014-10-23 (×13): qty 1

## 2014-10-23 MED ORDER — FLUTICASONE PROPIONATE 50 MCG/ACT NA SUSP
2.0000 | Freq: Every day | NASAL | Status: DC
  Administered 2014-10-23 – 2014-10-29 (×7): 2 via NASAL
  Filled 2014-10-23: qty 16

## 2014-10-23 MED ORDER — AMANTADINE HCL 100 MG PO CAPS
100.0000 mg | ORAL_CAPSULE | Freq: Two times a day (BID) | ORAL | Status: DC
  Administered 2014-10-23 – 2014-10-29 (×13): 100 mg via ORAL
  Filled 2014-10-23 (×16): qty 1

## 2014-10-23 MED ORDER — LORAZEPAM 0.5 MG PO TABS
0.5000 mg | ORAL_TABLET | Freq: Every day | ORAL | Status: DC
  Administered 2014-10-25 – 2014-10-27 (×3): 0.5 mg via ORAL
  Filled 2014-10-23 (×5): qty 1

## 2014-10-23 NOTE — Plan of Care (Signed)
Problem: Alteration in thought process Goal: LTG-Patient behavior demonstrates decreased signs psychosis (Patient behavior demonstrates decreased signs of psychosis to the point the patient is safe to return home and continue treatment in an outpatient setting.)  Outcome: Progressing Progressing but remains psychotic

## 2014-10-23 NOTE — Plan of Care (Signed)
Problem: Ineffective individual coping Goal: STG: Pt will be able to identify effective and ineffective STG: Pt will be able to identify effective and ineffective coping patterns  Outcome: Not Progressing Significant portion of time spent during Bedtime Medication to persuade patient to take her medications; this is not the first time that patient would take most of these medications (amantadine was given this morning); patient refused Lorazepam 0.5mg  as scheduled. Goal: STG: Patient will remain free from self harm Outcome: Progressing Patient remained free harm herself or others, 15 minutes observation check maintained for safety, denied CAH or AV/H, patient denied SI/HI. Will continue to monitor for safety.

## 2014-10-23 NOTE — Tx Team (Signed)
Interdisciplinary Treatment Plan Update (Adult)  Date:  10/23/2014 Time Reviewed:  11:16 AM  Progress in Treatment: Attending groups: Yes. Participating in groups:  Yes. Taking medication as prescribed:  Yes. Tolerating medication:  Yes. Family/Significant othe contact made:  Yes, called patient's exhusband/children's father Link Snuffer, son Jess Barters, and cousin  Patient understands diagnosis:  No. and As evidenced by:  Limited insight  Discussing patient identified problems/goals with staff:  Yes. Medical problems stabilized or resolved:  Yes. Denies suicidal/homicidal ideation: Yes. Issues/concerns per patient self-inventory:  No. Other:  New problem(s) identified: NA  Discharge Plan or Barriers: Pt is currently on the [facility] waitlist. Patient has no supportive family and very limited information on patient's previous history. Patient is referred for Care Coordinator at [facility]. Patient is understood to be uninsured and homeless at this time from [place]. Patient continues to present paranoid, bizarre behaviors wearing underwear on her head and disorganized. APS is referred for in [facility] for guardianship and will need financial assets located to place in group home. It is understood by family that patient has disability income.   Reason for Continuation of Hospitalization: Delusions  Medication stabilization  Comments: Follow-up for this 44 year old woman with schizophrenia as well as high blood pressure Pt seen in her room then in the hall way. Pt is seen pacing up and down all the time and though she attends Groups she is rather distracted and is disrupitve. At times. Today she reports that she is sleeping until 11.00am and she is used to wake up at 5.00am. P t was informed that she should go to bed early she reports that she is from [place] and so she cannot do that.. When questions are asked she answers and most of it disconnected and dis- jointed  and does not make much sense. Very disorganized in her thoughts and her answers. Staff reports that she has not been agitated and not been argumentative. This appears to be her baseline functioning and discussed in TT about discharge if she continues to be the same.  Estimated length of stay: up to 7 days, estimated discharge Tuesday 10/29/14  New goal(s): NA  Review of initial/current patient goals per problem list:   See Care Plan  Attendees: Physician:  Radene Journey, MD  8/10/201611:16 AM  Nursing:   Cristela Felt, RN  8/10/201611:16 AM  Clinical Social Worker: Beryl Meager, LCSWA 8/10/201611:16 AM   Scribe for Treatment Team:   Lulu Riding, MSW, LCSWA  10/23/2014, 11:16 AM

## 2014-10-23 NOTE — [facility] Group Notes (Signed)
[facility] LCSW Group Therapy  10/23/2014 2:40 PM  Type of Therapy:  Group Therapy  Participation Level:  Did Not Attend  Modes of Intervention:  Discussion, Education, Socialization and Support  Summary of Progress/Problems: Emotional Regulation: Patients will identify both negative and positive emotions. They will discuss emotions they have difficulty regulating and how they impact their lives. Patients will be asked to identify healthy coping skills to combat unhealthy reactions to negative emotions.     Wassim Kirksey L Jvon Meroney MSW, LCSWA  10/23/2014, 2:40 PM 

## 2014-10-23 NOTE — [facility] Group Notes (Signed)
[facility] Group Notes:  (Nursing/MHT/Case Management/Adjunct)  Date:  10/23/2014  Time:  1:35 PM  Type of Therapy:  Psychoeducational Skills  Participation Level:  Did Not Attend   Lynelle Smoke [facility] 10/23/2014, 1:35 PM

## 2014-10-23 NOTE — Progress Notes (Signed)
D: Patient voice of concerns around her medication the color of certain pills she didn't like " red" No interaction with her peers or staff.  Patient slept during am shift . Voice concerns. Appetite good and voice no other concerns. Limited insight into problems and concerns . Thought process remained altered . A: Encourage patient participation  And come to satff for any concerns. Instructions given on medication continue to reinstruct R: Voice no other concerns ,staff continue to monitor .

## 2014-10-23 NOTE — Clinical Social Work Note (Signed)
CSW called Desere Delorse Lek Guardianship Supervisor at [facility] [phone] to coordiantor guardianship for patient and left voicemail and stated that this writer also has a supporting letter from the psychiatrist for guardianship and to locate patient's payee for her SSDI if she in fact has SSDI. CSW called Desere once in the morning and once in the afternoon leaving VM both calls and awaiting callback.  CSW also received call from Wynema Birch [phone] of [facility] for [employer] who stated patient was seen a year ago in [facility] and would need to call intake [phone] to schedule patient appt and send d/c summary.

## 2014-10-23 NOTE — [facility] Group Notes (Signed)
[facility] LCSW Aftercare Discharge Planning Group Note   10/23/2014 10:40 AM  Participation Quality:  Did not attend group    Almena Hokenson T, MSW, LCSWA   

## 2014-10-23 NOTE — Progress Notes (Signed)
D: Patient denies SI/HI/AVH. Patient affect is flat and her mood is depressed.  Patient did attend evening group. Patient visible on the milieu. No distress noted. A: Support and encouragement offered. Scheduled medications given to pt. Q 15 min checks continued for patient safety. R: Patient receptive. Patient remains safe on the unit.   

## 2014-10-23 NOTE — Progress Notes (Addendum)
[facility] MD Progress Note  10/23/2014 8:53 AM Gloria Frank  MRN:  [id number]   Subjective: patient reports doing well today. She says she took all her medications today. Denies any major problems with restless sleep overnight. She complains of feeling congested and requested to be prescribed with Flonase. Other than that the patient denies problems with his sleep, appetite, energy, concentration. Denies SI, HI or having auditory or visual hallucinations. The patient denies having any side effects from her medications. Patient denies having any other physical complaints today.  Patient continues to walk around the unit wearing underwear on her head. Psychotic symptoms appear more florid when patient is confronted with information she dislikes such as issues related with medications and discharge plans to transfer her to the state facility.  This patient has no disability or Medicaid. There is no family in the area that is supportive even though she claims her children are supportive.  We have contacted the patient's ex husband  and one of her sons and they do not have any information about her or any contact with her on a regular basis.  Patient states she is a resident of [place] and at other times she is states she is going to move to [place] with friends and with her children. However she is unable to provide any contact information for anybody neither and address.  At this point we do not have any ability to place her in a group home or to set her up with ACT team  services as she is uninsured.  Patient refuses to make any changes with her medications and is very reluctant to even increase in the dose of medications.   She is not an immediate threat to the safety of her peers or staff, there is no agitation, aggression in order to justify forcing injectable anti-psychotics.  She is willing to continue the treatment with the recommended dose of risperidone however this medication has only partially  helped her.  This patient is in need of guardianship, disability and placement.  Due to her level of disorganization she will not be able to safely care for herself and not sure her stability. If discharged to a homeless shelter the patient is likely to decompensate immediately as is very likely she will not continue any of her anti-psychotics. She has refused to receive any long-acting injectable anti-psychotics. We have strongly feel that he is in the best interest of the patient to be transferred to a long-term care unit where all the social issues could be addressed.   Sleep is poor (6h) and oral intake is fair.  Per nursing staff: Patient has had a labile mood today. She is upset to find out that staff is trying to assign a guardian to her. She states that she has known people who have had guardians, and they don't "let them have any decent clothes." She voiced fears about losing control of her choices if she has a guardian. "Once you have one, you can't say you don't want them anymore. Patient denies SI/HI/AVH. Thoughts mostly goal-directed. Still wearing underwear on her head as a scarf. Will continue to monitor.     Principal Problem: Schizophrenia, unspecified type Diagnosis:   Patient Active Problem List   Diagnosis Date Noted  . Schizophrenia, unspecified type [F20.9] 09/24/2014  . High blood pressure [I10] 09/23/2014   Total Time spent with patient: 30 minutes   Past Medical History: History reviewed. No pertinent past medical history. History reviewed. No pertinent past  surgical history. Family History: History reviewed. No pertinent family history. Social History:  History  Alcohol Use No     History  Drug Use No    Social History   Social History  . Marital Status: Married    Spouse Name: N/A  . Number of Children: N/A  . Years of Education: N/A   Social History Main Topics  . Smoking status: Never Smoker   . Smokeless tobacco: None  . Alcohol Use: No  . Drug  Use: No  . Sexual Activity: Not Asked   Other Topics Concern  . None   Social History Narrative    Sleep: Good   Appetite:  Good    Musculoskeletal: Strength & Muscle Tone: within normal limits Gait & Station: normal Patient leans: N/A   Psychiatric Specialty Exam: Physical Exam  Nursing note and vitals reviewed. Constitutional: She is oriented to person, place, and time. She appears well-developed.  HENT:  Head: Normocephalic and atraumatic.  Respiratory: Effort normal.  Musculoskeletal: Normal range of motion.  Neurological: She is alert and oriented to person, place, and time.  Psychiatric: She expresses inappropriate judgment.    Review of Systems  Constitutional: Negative.   HENT: Negative.   Eyes: Negative.   Respiratory: Negative.   Cardiovascular: Negative.   Gastrointestinal: Negative.  Negative for heartburn.  Genitourinary: Negative.   Musculoskeletal: Negative.   Skin: Negative.   Neurological: Negative.   Endo/Heme/Allergies: Negative.   Psychiatric/Behavioral: Negative.  Negative for depression, suicidal ideas, hallucinations, memory loss and substance abuse. The patient is not nervous/anxious and does not have insomnia.     Blood pressure 150/94, pulse 59, temperature 98.2 F (36.8 C), temperature source Oral, resp. rate 20, height  (1.626 m), weight 79.379 kg (175 lb), SpO2 100 %.Body mass index is 30.02 kg/(m^2).  General Appearance: Bizarre  Eye Contact::  Fair  Speech:  Normal Rate  Volume:  Decreased  Mood:  Good  Affect:  Flat   Thought Process:  Disorganized  Orientation:  Full (Time, Place, and Person)  Thought Content:  Delusions  Suicidal Thoughts:  No  Homicidal Thoughts:  No  Memory:  Immediate;   Good Recent;   Fair Remote;   Poor  Judgement:  Impaired  Insight:  Lacking  Psychomotor Activity:  Decreased  Concentration:  Poor  Recall:  Poor  Fund of Knowledge:Poor  Language: Poor  Akathisia:  No  Handed:  Right   AIMS (if indicated):     Assets:  Communication Skills Physical Health  ADL's:  Intact  Cognition: WNL  Sleep:  Number of Hours: 6.5     Current Medications: Current Facility-Administered Medications  Medication Dose Route Frequency Provider Last Rate Last Dose  . acetaminophen (TYLENOL) tablet 650 mg  650 mg Oral Q6H PRN Audery Amel, MD   650 mg at 10/22/14 1611  . alum & mag hydroxide-simeth (MAALOX/MYLANTA) 200-200-20 MG/5ML suspension 30 mL  30 mL Oral Q4H PRN Audery Amel, MD   30 mL at 10/13/14 1007  . atenolol (TENORMIN) tablet 12.5 mg  12.5 mg Oral Daily Jimmy Footman, MD   12.5 mg at 10/22/14 0912  . loratadine (CLARITIN) tablet 10 mg  10 mg Oral Daily Jimmy Footman, MD   10 mg at 10/22/14 0912  . LORazepam (ATIVAN) tablet 1 mg  1 mg Oral QHS Jimmy Footman, MD   1 mg at 10/20/14 2200  . magnesium hydroxide (MILK OF MAGNESIA) suspension 30 mL  30 mL Oral Daily PRN  Audery Amel, MD   30 mL at 10/06/14 0026  . risperiDONE (RISPERDAL M-TABS) disintegrating tablet 8 mg  8 mg Oral QHS Jimmy Footman, MD   8 mg at 10/22/14 2125  . sodium chloride (OCEAN) 0.65 % nasal spray 1 spray  1 spray Each Nare PRN Jimmy Footman, MD      . tuberculin injection 5 Units  5 Units Intradermal Once Jimmy Footman, MD   5 Units at 10/22/14 1603    Lab Results:  Results for orders placed or performed during the hospital encounter of 09/23/14 (from the past 48 hour(s))  Urinalysis complete, with microscopic ([facility] only)     Status: Abnormal   Collection Time: 10/22/14 11:09 PM  Result Value Ref Range   Color, Urine YELLOW (A) YELLOW   APPearance CLEAR (A) CLEAR   Glucose, UA NEGATIVE NEGATIVE mg/dL   Bilirubin Urine NEGATIVE NEGATIVE   Ketones, ur NEGATIVE NEGATIVE mg/dL   Specific Gravity, Urine 1.010 1.005 - 1.030   Hgb urine dipstick NEGATIVE NEGATIVE   pH 6.0 5.0 - 8.0   Protein, ur NEGATIVE NEGATIVE mg/dL   Nitrite  NEGATIVE NEGATIVE   Leukocytes, UA 2+ (A) NEGATIVE   RBC / HPF 0-5 0 - 5 RBC/hpf   WBC, UA 6-30 0 - 5 WBC/hpf   Bacteria, UA NONE SEEN NONE SEEN   Squamous Epithelial / LPF 0-5 (A) NONE SEEN    Physical Findings: AIMS: Facial and Oral Movements Muscles of Facial Expression: None, normal Lips and Perioral Area: None, normal Jaw: None, normal Tongue: None, normal,Extremity Movements Upper (arms, wrists, hands, fingers): None, normal Lower (legs, knees, ankles, toes): None, normal, Trunk Movements Neck, shoulders, hips: None, normal, Overall Severity Severity of abnormal movements (highest score from questions above): None, normal Incapacitation due to abnormal movements: None, normal Patient's awareness of abnormal movements (rate only patient's report): No Awareness, Dental Status Current problems with teeth and/or dentures?: No Does patient usually wear dentures?: No  CIWA:  CIWA-Ar Total: 0 COWS:     Treatment Plan Summary:  44 year old with  diagnosis of schizophrenia. Presented to our emergency department under petition due to bizarre behavior and paranoia. Patient is a poor historian at this time and there is no collateral information. Per records looks the patient was in our system backing 2002 for psychosis in [facility] emergency department.  Schizophrenia: I discussed with her the possibility of trying Invega instead of Risperdal as it is possible we can go higher on the dose of Invega without having as much sedation as it is seen with the higher dose of Risperdal. Also  she could benefit from invaga sustenna. She refused to change Risperdal to [place]. Therefore risperidone Mtab has been increased to 8 mg at bedtime. She continues to deny having a mental illness and continues to have disorganized thought processes.  Insomnia: I will decrease the Ativan to 0.5 mg by mouth daily at bedtime for insomnia as she has been overly sedated at the 1 mg dose.  Hypertension: Continue  atenolol 12.5 mg a day as patient is tachycardic.  Heart rate is much improved since started on atenolol  UTI: UA shows signs of infection. She will be started on Macrobid for 5 days.  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 I will offer amantadine 100 mg by mouth twice a day.  Nasal congestion: continue ocean spray, Claritin and today I will start the patient on Flonase.  Labs: TSH and hemoglobin A1c  and lipid panel were within the normal limits. HIV and RPR nonreactive. Vitamin B12 was within the normal limits. Compressive metabolic panel and CBC and basically within the normal limits. Prolactin level is 169. Patient appears to be asymptomatic however is difficult to verify this as she is very suspicious and uncooperative at times to questioning.  Collateral information: Patient's son and ex-husband have been contacted. They are not close. The patient. They do not have any information about her. Her son is states that he was removed from her custody at a young age due to her issues with schizophrenia. The son stated the last he knew was that she was living somewhere in [place].    Precautions continue every 15 minute checks  Hospitalization status continue involuntary commitment  On waiting list for [facility]: Severe psychosocial stressors to prevent as from discharging her from this acute facility. This patient is very disorganized and unable to care for herself without supervision. She is in need of guardianship. Disability and placement. There is no family support or any other resources.  If discharged to a homeless shelter the patient is a likely to decompensate immediately.  Discharge planning: Patient was not opposed to discharge to a group home however per our social worker she does not receive Medicaid or disability. [facility] patient is not in their system and has not had receive any services by them. Social worker was also not able to  find anything with [facility].  Both family members that we have contacted the patient was living somewhere in [place]. Most likely she was living in a homeless shelter in the area.  Social worker contacted the patient's causing him [name]. He reported that the patient has been missing for 3 months. He reports that she was living in a apartment in [facility]. It is unclear as to how she was paying for this apartment. Today the case was discussed with him all social workers. We hope that we can start the process of applying for Medicaid and disability from here. They also stated that maybe they can write a letter of a guarantee have the patient discharged to a group home in her area with a referral for APS as she needs a guardian.  PPD placed on 8/9 Letter for APS recommending guardianship has been completed.  I certify that the services received since the previous certification/recertification were and continue to be medically necessary as the treatment provided can be reasonably expected to improve the patient's condition; the medical record documents that the services furnished were intensive treatment services or their equivalent services, and this patient continues to need, on a daily basis, active treatment furnished directly by or requiring the supervision of inpatient psychiatric personnel.  Medical Decision Making:  Established Problem, Stable/Improving (1), Review of Medication Regimen & Side Effects (2) and Review of New Medication or Change in Dosage (2)     Jimmy Footman 10/23/2014, 8:53 AM

## 2014-10-23 NOTE — Progress Notes (Signed)
Recreation Therapy Notes  Date: 08.10.16 Time: 3:00 pm Location: Craft Room  Group Topic: Self-esteem  Goal Area(s) Addresses:  Patient will write down at least one positive trait about self.  Behavioral Response: Did not attend  Intervention: I Am  Activity: Patients were given a worksheet with the letter I on it and instructed to list as many positive traits as they could inside the I.   Education: LRT educated patients on ways they can increase their self-esteem.  Education Outcome: Patient did not attend group.  Clinical Observations/Feedback: Patient did not attend group.  Terresa Marlett M, LRT/CTRS 10/23/2014 4:14 PM 

## 2014-10-24 NOTE — Plan of Care (Signed)
Problem: Ineffective individual coping Goal: STG: Pt will be able to identify effective and ineffective STG: Pt will be able to identify effective and ineffective coping patterns  Outcome: Progressing Calm and cooperative. Flat affect. Med compliant. Denies SI/HI. No AV/H noted. Will continue to monitor behavior.

## 2014-10-24 NOTE — Progress Notes (Signed)
Recreation Therapy Notes  Date: 08.11.16 Time: 3:00 pm Location: Craft Room  Group Topic: Leisure Education  Goal Area(s) Addresses:  Patient will participate in leisure activity. Patient will verbalize one emotion experienced during leisure activity.  Behavioral Response: Did not attend  Intervention: Coloring  Activity: Patients were given coloring sheets and instructed to color.  Education: LRT educated patients on the importance of participating in leisure.  Education Outcome: Patient did not attend group.  Clinical Observations/Feedback: Patient did not attend group.  Jacquelynn Cree, LRT/CTRS 10/24/2014 4:22 PM

## 2014-10-24 NOTE — [facility] Group Notes (Signed)
[facility] Group Notes:  (Nursing/MHT/Case Management/Adjunct)  Date:  10/24/2014  Time:  8:50 AM  Type of Therapy:  Community Meeting   Participation Level:  Did Not Attend  Summary of Progress/Problems:  Gloria Frank De'Chelle Karron Alvizo 10/24/2014, 8:50 AM 

## 2014-10-24 NOTE — Progress Notes (Addendum)
Patient demonstrated understanding of the discharge plans when she said, "I may be going to [name], but I will prefer to go to my uncle; also, my Gloria Frank, just get a new job, he will help me, I will be fine..." Patient, initially, c/o shoulder blade pain 8/10,Tylenol 650 mg given provided relief on re-assessment. Argumentative during medication administration asking the same repetitious question for old and new medications. Visible in the Dayroom, noted for reduced pacing and having a constructive/positive interaction with peers.

## 2014-10-24 NOTE — Progress Notes (Signed)
[facility] MD Progress Note  10/24/2014 8:05 AM Gloria Frank  MRN:  [id number]   Subjective: patient reports doing well today. She says she took all her medications today. Denies any major problems with restless sleep overnight. She complains of feeling congested and requested to be prescribed with Flonase. Other than that the patient denies problems with his sleep, appetite, energy, concentration. Denies SI, HI or having auditory or visual hallucinations. The patient denies having any side effects from her medications. Patient denies having any other physical complaints today. Patient has been noted not to be as disorganized over the last 2 days. Suspiciousness is is still evident at times. The patient now is saying she has an on call in [facility] name Gloria Frank has told her in the past she can stay there. Patient however does not want to give me his address as she keeps saying "you have it on file" and does not know his phone number.  Patient continues to walk around the unit wearing underwear on her head. Psychotic symptoms appear more florid when patient is confronted with information she dislikes such as issues related with medications and discharge plans to transfer her to the state facility.  This patient has no disability or Medicaid. There is no family in the area that is supportive even though she claims her children are supportive.  We have contacted the patient's ex husband  and one of her sons and they do not have any information about her or any contact with her on a regular basis.  Patient states she is a resident of [place] and at other times she is states she is going to move to [place] with friends and with her children. However she is unable to provide any contact information for anybody neither and address.  At this point we do not have any ability to place her in a group home or to set her up with ACT team  services as she is uninsured.  Patient refuses to make any changes  with her medications and is very reluctant to even increase in the dose of medications.   She is not an immediate threat to the safety of her peers or staff, there is no agitation, aggression in order to justify forcing injectable anti-psychotics.  She is willing to continue the treatment with the recommended dose of risperidone however this medication has only partially helped her.  This patient is in need of guardianship, disability and placement.  Due to her level of disorganization she will not be able to safely care for herself and not sure her stability. If discharged to a homeless shelter the patient is likely to decompensate immediately as is very likely she will not continue any of her anti-psychotics. She has refused to receive any long-acting injectable anti-psychotics. We have strongly feel that he is in the best interest of the patient to be transferred to a long-term care unit where all the social issues could be addressed.   Sleep is fair (5h) and oral intake is fair.  Per nursing staff: Patient demonstrated understanding of the discharge plans when she said, "I may be going to [facility], but I will prefer to go to my uncle; also, my Gloria Frank, just get a new job, he will help me, I will be fine..." Patient, initially, c/o shoulder blade pain 8/10,Tylenol 650 mg given provided relief on re-assessment. Argumentative during medication administration asking the same repetitious question for old and new medications. Visible in the Dayroom,  noted for reduced pacing and having a constructive/positive interaction with peers.     Principal Problem: Schizophrenia, unspecified type Diagnosis:   Patient Active Problem List   Diagnosis Date Noted  . Schizophrenia, unspecified type [F20.9] 09/24/2014  . High blood pressure [I10] 09/23/2014   Total Time spent with patient: 30 minutes   Past Medical History: History reviewed. No pertinent past medical history. History  reviewed. No pertinent past surgical history. Family History: History reviewed. No pertinent family history. Social History:  History  Alcohol Use No     History  Drug Use No    Social History   Social History  . Marital Status: Married    Spouse Name: N/A  . Number of Children: N/A  . Years of Education: N/A   Social History Main Topics  . Smoking status: Never Smoker   . Smokeless tobacco: None  . Alcohol Use: No  . Drug Use: No  . Sexual Activity: Not Asked   Other Topics Concern  . None   Social History Narrative    Sleep: Good   Appetite:  Good    Musculoskeletal: Strength & Muscle Tone: within normal limits Gait & Station: normal Patient leans: N/A   Psychiatric Specialty Exam: Physical Exam  Nursing note and vitals reviewed. Constitutional: She is oriented to person, place, and time. She appears well-developed.  HENT:  Head: Normocephalic and atraumatic.  Respiratory: Effort normal.  Musculoskeletal: Normal range of motion.  Neurological: She is alert and oriented to person, place, and time.  Psychiatric: She expresses inappropriate judgment.    Review of Systems  Constitutional: Negative.   HENT: Positive for congestion.   Eyes: Negative.   Respiratory: Negative.   Cardiovascular: Negative.   Gastrointestinal: Negative.  Negative for heartburn.  Genitourinary: Negative.   Musculoskeletal: Negative.   Skin: Negative.   Neurological: Negative.   Endo/Heme/Allergies: Negative.   Psychiatric/Behavioral: Negative.  Negative for depression, suicidal ideas, hallucinations, memory loss and substance abuse. The patient is not nervous/anxious and does not have insomnia.     Blood pressure 131/80, pulse 72, temperature 98.2 F (36.8 C), temperature source Oral, resp. rate 20, height 5\' 4"  (1.626 m), weight 79.379 kg (175 lb), SpO2 100 %.Body mass index is 30.02 kg/(m^2).  General Appearance: Bizarre  Eye Contact::  Fair  Speech:  Normal Rate   Volume:  Decreased  Mood:  Good  Affect:  Flat   Thought Process:  Disorganized  Orientation:  Full (Time, Place, and Person)  Thought Content:  Delusions  Suicidal Thoughts:  No  Homicidal Thoughts:  No  Memory:  Immediate;   Good Recent;   Fair Remote;   Poor  Judgement:  Impaired  Insight:  Lacking  Psychomotor Activity:  Decreased  Concentration:  Poor  Recall:  Poor  Fund of Knowledge:Poor  Language: Poor  Akathisia:  No  Handed:  Right  AIMS (if indicated):     Assets:  Communication Skills Physical Health  ADL's:  Intact  Cognition: WNL  Sleep:  Number of Hours: 5.25     Current Medications: Current Facility-Administered Medications  Medication Dose Route Frequency Provider Last Rate Last Dose  . acetaminophen (TYLENOL) tablet 650 mg  650 mg Oral Q6H PRN Gloria Amel, MD   650 mg at 10/23/14 2018  . alum & mag hydroxide-simeth (MAALOX/MYLANTA) 200-200-20 MG/5ML suspension 30 mL  30 mL Oral Q4H PRN Gloria Amel, MD   30 mL at 10/13/14 1007  . amantadine (SYMMETREL) capsule 100 mg  100 mg Oral BID Gloria Footman, MD   100 mg at 10/23/14 2136  . atenolol (TENORMIN) tablet 12.5 mg  12.5 mg Oral Daily Gloria Footman, MD   12.5 mg at 10/23/14 0955  . fluticasone (FLONASE) 50 MCG/ACT nasal spray 2 spray  2 spray Each Nare Daily Gloria Footman, MD   2 spray at 10/23/14 1836  . loratadine (CLARITIN) tablet 10 mg  10 mg Oral Daily Gloria Footman, MD   10 mg at 10/23/14 0956  . LORazepam (ATIVAN) tablet 0.5 mg  0.5 mg Oral QHS Gloria Footman, MD   0.5 mg at 10/23/14 2134  . magnesium hydroxide (MILK OF MAGNESIA) suspension 30 mL  30 mL Oral Daily PRN Gloria Amel, MD   30 mL at 10/06/14 0026  . nitrofurantoin (macrocrystal-monohydrate) (MACROBID) capsule 100 mg  100 mg Oral Q12H Gloria Footman, MD   100 mg at 10/23/14 2134  . risperiDONE (RISPERDAL M-TABS) disintegrating tablet 8 mg  8 mg Oral QHS Gloria Footman, MD   8 mg at 10/23/14 2134  . sodium chloride (OCEAN) 0.65 % nasal spray 1 spray  1 spray Each Nare PRN Gloria Footman, MD      . tuberculin injection 5 Units  5 Units Intradermal Once Gloria Footman, MD   5 Units at 10/22/14 1603    Lab Results:  Results for orders placed or performed during the hospital encounter of 09/23/14 (from the past 48 hour(s))  Urinalysis complete, with microscopic ([facility] only)     Status: Abnormal   Collection Time: 10/22/14 11:09 PM  Result Value Ref Range   Color, Urine YELLOW (A) YELLOW   APPearance CLEAR (A) CLEAR   Glucose, UA NEGATIVE NEGATIVE mg/dL   Bilirubin Urine NEGATIVE NEGATIVE   Ketones, ur NEGATIVE NEGATIVE mg/dL   Specific Gravity, Urine 1.010 1.005 - 1.030   Hgb urine dipstick NEGATIVE NEGATIVE   pH 6.0 5.0 - 8.0   Protein, ur NEGATIVE NEGATIVE mg/dL   Nitrite NEGATIVE NEGATIVE   Leukocytes, UA 2+ (A) NEGATIVE   RBC / HPF 0-5 0 - 5 RBC/hpf   WBC, UA 6-30 0 - 5 WBC/hpf   Bacteria, UA NONE SEEN NONE SEEN   Squamous Epithelial / LPF 0-5 (A) NONE SEEN    Physical Findings: AIMS: Facial and Oral Movements Muscles of Facial Expression: None, normal Lips and Perioral Area: None, normal Jaw: None, normal Tongue: None, normal,Extremity Movements Upper (arms, wrists, hands, fingers): None, normal Lower (legs, knees, ankles, toes): None, normal, Trunk Movements Neck, shoulders, hips: None, normal, Overall Severity Severity of abnormal movements (highest score from questions above): None, normal Incapacitation due to abnormal movements: None, normal Patient's awareness of abnormal movements (rate only patient's report): No Awareness, Dental Status Current problems with teeth and/or dentures?: No Does patient usually wear dentures?: No  CIWA:  CIWA-Ar Total: 0 COWS:     Treatment Plan Summary:  44 year old with  diagnosis of schizophrenia. Presented to our emergency department under petition due to  bizarre behavior and paranoia. Patient is a poor historian at this time and there is no collateral information. Per records looks the patient was in our system backing 2002 for psychosis in [facility] emergency department.  Schizophrenia: Continue risperdal Mtab 8 mg qhs. Patient has not been willing to take any other medications. Patient has declined from receiving a long acting injectable. Patient has poor insight into her condition and continues to deny having a mental illness.  Insomnia: Continue Ativan 0.5 mg by  mouth daily at bedtime   Hypertension: Continue atenolol 12.5 mg a day as patient is tachycardic.  Heart rate is much improved since started on atenolol  UTI: UA shows signs of infection. Continue Macrobid for 5 days.  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 Patient has been started on amantadine 100 mg by mouth twice a day which she has complied with.  Nasal congestion: continue ocean spray, Claritin and today I will start the patient on Flonase.  Labs: TSH and hemoglobin A1c and lipid panel were within the normal limits. HIV and RPR nonreactive. Vitamin B12 was within the normal limits. Compressive metabolic panel and CBC and basically within the normal limits. Prolactin level is 169. Patient appears to be asymptomatic however is difficult to verify this as she is very suspicious and uncooperative at times to questioning.  Collateral information: Patient's son and ex-husband have been contacted. They are not close. The patient. They do not have any information about her. Her son is states that he was removed from her custody at a young age due to her issues with schizophrenia. The son stated the last he knew was that she was living somewhere in [place].    Precautions continue every 15 minute checks  Hospitalization status continue involuntary commitment  On waiting list for [facility]: Severe psychosocial stressors to prevent as from  discharging her from this acute facility. This patient is very disorganized and unable to care for herself without supervision. She is in need of guardianship. Disability and placement. There is no family support or any other resources.  If discharged to a homeless shelter the patient is a likely to decompensate immediately.  Discharge planning: Patient was not opposed to discharge to a group home however per our social worker she does not receive Medicaid or disability. [employer] patient is not in their system and has not had receive any services by them. Social worker was also not able to find anything with [facility].  Both family members that we have contacted the patient was living somewhere in [place]. Most likely she was living in a homeless shelter in the area.  Social worker contacted the patient's causing him [name]. He reported that the patient has been missing for 3 months. He reports that she was living in a apartment in [facility]. It is unclear as to how she was paying for this apartment. Today the case was discussed with him all social workers. We hope that we can start the process of applying for Medicaid and disability from here. They also stated that maybe they can write a letter of a guarantee have the patient discharged to a group home in her area with a referral for APS as she needs a guardian.  PPD placed on 8/9 Letter for APS recommending guardianship has been completed.  Once again will attend today to get the number from her uncle out of her cell phone. We had attempted to do that before but the patient after agreeing with getting the cell phone out her to lock her in charge and it told the nurse "you don't have any business charging my phone" so we were not able to get any information.  Medical Decision Making:  Established Problem, Stable/Improving (1), Review of Medication Regimen & Side Effects (2) and Review of New Medication  or Change in Dosage (2)     Gloria Frank,  Gloria Frank 10/24/2014, 8:05 AM

## 2014-10-24 NOTE — [facility] Group Notes (Signed)
[facility] LCSW Group Therapy  10/24/2014 12:29 PM  Type of Therapy: Group Therapy  Participation Level: Did Not Attend  Modes of Intervention: Discussion, Education, Socialization and Support  Summary of Progress/Problems:Balance in life: Patients will discuss the concept of balance and how it looks and feels to be unbalanced. Pt will identify areas in their life that is unbalanced and ways to become more balanced.    Nabilah Davoli L Suleman Gunning MSW, LCSWA  10/24/2014, 12:29 PM 

## 2014-10-24 NOTE — [facility] Group Notes (Signed)
[facility] Group Notes:  (Nursing/MHT/Case Management/Adjunct)  Date:  10/24/2014  Time:  2:16 PM  Type of Therapy:  Psychoeducational Skills  Participation Level:  Minimal  Participation Quality:  Appropriate  Affect:  Flat  Cognitive:  Appropriate  Insight:  Appropriate  Engagement in Group:  Limited  Modes of Intervention:  Activity, Discussion and Support  Summary of Progress/Problems:  Gloria Frank 10/24/2014, 2:16 PM

## 2014-10-25 NOTE — [facility] Group Notes (Signed)
[facility] Group Notes:  (Nursing/MHT/Case Management/Adjunct)  Date:  10/25/2014  Time:  1:26 PM  Type of Therapy:  Psychoeducational Skills  Participation Level:  Did Not Attend    Gloria Frank 10/25/2014, 1:26 PM

## 2014-10-25 NOTE — [facility] Group Notes (Signed)
[facility] LCSW Aftercare Discharge Planning Group Note   10/25/2014 10:04 AM  Participation Quality:  Minimal   Mood/Affect:  Flat  Depression Rating:  0  Anxiety Rating:  0  Thoughts of Suicide:  No Will you contract for safety?   NA  Current AVH:  No  Plan for Discharge/Comments:  CSW is looking into group home placements   Transportation Means: Bus or family   Supports: Family   Cambelle Suchecki [employer] MSW, [address]

## 2014-10-25 NOTE — Progress Notes (Signed)
Recreation Therapy Notes  Date: 08.12.16 Time: 3:00 pm Location: Craft Room  Group Topic: Self-expression  Goal Area(s) Addresses:  Patient will effectively use art as a means of self-expression. Patient will recognize positive benefit of self-expression. Patient will be able to identify one emotion experienced during group session. Patient will identify use of art/self-expression as a coping skill.  Behavioral Response: Did not attend  Intervention: Two Faces of Me  Activity: Patients were given a blank face worksheet and instructed to draw a line down the middle. On one side of the face they were told to draw or write how they felt when they were admitted. On the other side, they were told to draw or write how they want to feel when they are d/c.  Education: LRT educated patients on different forms of self-expression  Education Outcome: Patient did not attend group.  Clinical Observations/Feedback: Patient did not attend group.  Jacquelynn Cree, LRT/CTRS 10/25/2014 4:08 PM

## 2014-10-25 NOTE — Progress Notes (Signed)
Patient is alert and oriented x 4. Her mood is euthymic with congruent affect. Denies hallucinations. No evidence of gross delusional thinking at present. Her speech is clear and coherent. Thoughts are mostly organized. Patient is significantly more aware of environment and her behaviors much improved. Patient has been compliant with all nursing interventions and is visible on the unit. She has no specific complaints. Discharge planning in progress.  Continue current treatment plan. Monitor mood and mental status; reality orientation as needed. Maintain on 15 minute checks for safety.

## 2014-10-25 NOTE — Plan of Care (Signed)
Problem: Ineffective individual coping Goal: LTG: Patient will report a decrease in negative feelings Outcome: Not Progressing Patient is more pleasant, but continues to be worried about medication and her hospital stay.  Goal: LTG-Other (Specify)- Outcome: Progressing Patient more pleasant and interactive in the milieu.  Goal: STG: Pt will be able to identify effective and ineffective STG: Pt will be able to identify effective and ineffective coping patterns  Outcome: Not Progressing Not progressing. Goal: STG: Patient will remain free from self harm Outcome: Progressing No self harm.

## 2014-10-25 NOTE — [facility] Group Notes (Signed)
[facility] LCSW Group Therapy  10/25/2014 5:08 PM  Type of Therapy:  Group Therapy  Participation Level:  Minimal  Participation Quality:  Attentive  Affect:  Flat  Cognitive:  Disorganized  Insight:  Improving  Engagement in Therapy:  Lacking  Modes of Intervention:  Socialization and Support  Summary of Progress/Problems: Patient came to group but only participated during introductions however was attentive throughout group AEB her body language and eye contact.   Lulu Riding, MSW, LCSWA 10/25/2014, 5:08 PM
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
## 2014-10-25 NOTE — Clinical Social Work Note (Signed)
CSW received call from DSS Guardianship Kylie Linard Millers [phone] cell [phone] office who will follow up with Social Services to see if patient indeed has disability income frozen until she has a payee. Kylie confirmed that DSS Guardianship office can start guardianship process as yesterday after noon 10/24/14 another DSS supervisor Nadine Counts stated that anyone could start the guardianship process by applying through the courthouse in [place] and this Clinical research associate had planned to go to [place] today 10/25/14 to get documents necessary however Kylie of DSS states she will start process and CSW faxed letter to [facility] from [facility] psychiatrist to support guardianship process. CSW called [place] [employer] MRS. Tasia Catchings [phone] who confirmed patient has disability frozen till she has a guardian. Kylie called this afternoon also confirming with [employer]. CSW spoke with patient and patient agreed to meeting with Tresea Mall [phone] of [facility] who has a female bed but was denied. CSW also spoke with Clinton Sawyer [phone] of [facility]  who will also visit patient. CSW will continue to search for placement for patient and has started [facility] if patient needs to be placed in [facility] or group home and will continue to follow up for placement and PASARR.

## 2014-10-26 ENCOUNTER — Encounter: Payer: Self-pay | Admitting: Psychiatry

## 2014-10-26 NOTE — Progress Notes (Signed)
Patient has remained in bed most of the day.  She was able to wake up and take medications.  States that she is "not feeling well", reports that she has a "cold".   Patient encouraged to get up for group but did not respond.  VSS will cont to monitor for safety.

## 2014-10-26 NOTE — Plan of Care (Signed)
Problem: Ineffective individual coping Goal: STG: Patient will remain free from self harm Outcome: Progressing Pt safe on the unit at this time   Problem: Alteration in thought process Goal: STG-Patient is able to follow short directions Outcome: Progressing Pt followed directions this evening

## 2014-10-26 NOTE — Progress Notes (Signed)
D: Pt denies SI/HI/AVH. Pt is pleasant and cooperative. Pt continues to have no insight into her Tx. Pt stated she did not want to go to the group home. Pt said she could sty with an uncle, but has not contacted him. Pt came to writer later in the evening requesting to go home. Pt was informed that she has no D/C plan and nowhere to go, pt appeared to understand and said she will get the plan together. Pt continues to pace the halls, but pt is not openly and outwardly stating delusional things and was not argumentative this evening with Clinical research associate. Pt  took her Ativan and stated she would let us know how she felt after at least giving it a try.    A: Pt was offered support and encouragement. Pt was given scheduled medications. Pt was encourage to attend groups. Q 15 minute checks were done for safety.   R: Pt is taking medication .Pt receptive to treatment and safety maintained on unit.
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
## 2014-10-26 NOTE — [facility] Group Notes (Signed)
[facility] LCSW Group Therapy  10/26/2014 2:06 PM  Type of Therapy: Group Therapy  Participation Level: Did Not Attend  Modes of Intervention: Discussion, Education, Socialization and Support  Summary of Progress/Problems: Pt will identify unhealthy thoughts and how they impact their emotions and behavior. Pt will be encouraged to discuss these thoughts, emotions and behaviors with the group.   Gloria Frank L Wm Sahagun MSW, LCSWA  10/26/2014, 2:06 PM 

## 2014-10-27 LAB — URINALYSIS COMPLETE WITH MICROSCOPIC (ARMC ONLY)
Bilirubin Urine: NEGATIVE
GLUCOSE, UA: NEGATIVE mg/dL
HGB URINE DIPSTICK: NEGATIVE
Ketones, ur: NEGATIVE mg/dL
Nitrite: NEGATIVE
PH: 5 (ref 5.0–8.0)
PROTEIN: NEGATIVE mg/dL
Specific Gravity, Urine: 1.025 (ref 1.005–1.030)

## 2014-10-27 NOTE — Progress Notes (Signed)
Pacing hallways and approaching nurse with health concerns. Asked nurse on numerous occasions about treatment for UTI. Is easily directable and understands to ask physician on next assessment lab results. Was med compliant. Denied AVH, SI, HI. Did join group outdoors.

## 2014-10-27 NOTE — [facility] Group Notes (Signed)
[facility] LCSW Group Therapy  10/27/2014 5:12 PM  Type of Therapy:  Group Therapy  Participation Level:  Did Not Attend  Modes of Intervention:  Discussion, Education, Socialization and Support  Summary of Progress/Problems: Todays topic: Grudges  Patients will be encouraged to discuss their thoughts, feelings, and behaviors as to why one holds on to grudges and reasons why people have grudges. Patients will process the impact of grudges on their daily lives and identify thoughts and feelings related to holding grudges. Patients will identify feelings and thoughts related to what life would look like without grudges.   Asif Muchow L Neal Trulson MSW, LCSWA  10/27/2014, 5:12 PM 
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
## 2014-10-27 NOTE — Progress Notes (Signed)
Patient is alert and oriented x4.  Patient stayed in bed for most of the shift.  Patient up pacing the floor and asking questions regarding her vaginal prolapse.  Patient provided with encouragement and reassurance.  Patient denies any SI/HI/AVH at this time.  Patient denies pain or discomfort.  Patient requesting discharge information, encouraged to speak with her doctor tomorrow.  Patient is pleasant, her affect is flat and her mood is sad and depressed.  Patient rates her anxiety and depression 0/10 at this time.

## 2014-10-28 ENCOUNTER — Inpatient Hospital Stay: Payer: No Typology Code available for payment source

## 2014-10-28 MED ORDER — RISPERIDONE 4 MG PO TBDP: 8.0000 mg | ORAL_TABLET | Freq: Every day | ORAL | Status: DC

## 2014-10-28 MED ORDER — LORAZEPAM 0.5 MG PO TABS: 0.2500 mg | ORAL_TABLET | Freq: Every day | ORAL | Status: DC

## 2014-10-28 MED ORDER — RISPERIDONE 4 MG PO TBDP: 8.0000 mg | ORAL_TABLET | Freq: Every day | ORAL | Status: AC

## 2014-10-28 MED ORDER — LORATADINE 10 MG PO TABS: 10.0000 mg | ORAL_TABLET | Freq: Every day | ORAL | Status: DC

## 2014-10-28 MED ORDER — FLUTICASONE PROPIONATE 50 MCG/ACT NA SUSP: 2.0000 | Freq: Every day | NASAL | Status: AC

## 2014-10-28 MED ORDER — RISPERIDONE 3 MG PO TABS
8.0000 mg | ORAL_TABLET | Freq: Every day | ORAL | Status: DC
  Administered 2014-10-28: 8 mg via ORAL
  Filled 2014-10-28: qty 2

## 2014-10-28 MED ORDER — LORATADINE 10 MG PO TABS: 10.0000 mg | ORAL_TABLET | Freq: Every day | ORAL | Status: AC

## 2014-10-28 MED ORDER — ATENOLOL 25 MG PO TABS: 12.5000 mg | ORAL_TABLET | Freq: Every day | ORAL | Status: AC

## 2014-10-28 MED ORDER — ATENOLOL 25 MG PO TABS: 12.5000 mg | ORAL_TABLET | Freq: Every day | ORAL | Status: DC

## 2014-10-28 MED ORDER — AMANTADINE HCL 100 MG PO CAPS: 100.0000 mg | ORAL_CAPSULE | Freq: Two times a day (BID) | ORAL | Status: AC

## 2014-10-28 MED ORDER — LORAZEPAM 0.5 MG PO TABS: 0.2500 mg | ORAL_TABLET | Freq: Every day | ORAL | Status: AC

## 2014-10-28 MED ORDER — SALINE SPRAY 0.65 % NA SOLN: 1.0000 | NASAL | Status: AC | PRN

## 2014-10-28 MED ORDER — AMANTADINE HCL 100 MG PO CAPS: 100.0000 mg | ORAL_CAPSULE | Freq: Two times a day (BID) | ORAL | Status: DC
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
## 2014-10-28 NOTE — Plan of Care (Signed)
Problem: Alteration in thought process Goal: STG-Patient is able to identify plan for continuing care at ( Patient is able to identify continuing plan for care at discharge)  Outcome: Progressing Acknowledge group home for discharge

## 2014-10-28 NOTE — [facility] Group Notes (Signed)
[facility] LCSW Group Therapy  10/28/2014 3:24 PM  Type of Therapy:  Group Therapy  Participation Level:  None  Participation Quality:  Attentive  Affect:  Flat  Cognitive:  Disorganized  Insight:  Lacking  Engagement in Therapy:  None  Modes of Intervention:  Socialization  Summary of Progress/Problems: Patient arrived late for the last 10 minutes of group but did not participated however was attentive AEB her body language and eye contact.   Lulu Riding, MSW, LCSWA 10/28/2014, 3:24 PM

## 2014-10-28 NOTE — Clinical Social Work Note (Signed)
CSW referred patient for placement to [facility] on Friday and was suppose to visit patient on Friday 10/25/14. CSW called Ortencia Kick Monday 10/28/14 and Ortencia Kick agreed to visit today. CSW also spoke with Francena Hanly [phone] at [facility] and visited patient on the BMU. CSW also spoke with Jimmye Norman [phone] who also offered a bed to patient after meeting with her. Patient chose [facility] and will discharge tomorrow 10/29/14 as soon as chest x ray is complete. DSS [facility] [phone] will be notified at discharge.

## 2014-10-28 NOTE — Plan of Care (Signed)
Problem: Ineffective individual coping Goal: STG: Patient will remain free from self harm Outcome: Progressing Pt safe on the unit     

## 2014-10-28 NOTE — Progress Notes (Signed)
D: Pt denies SI/HI/AVH. Pt is pleasant and cooperative. Pt is very suspicious. Pt is capable of holding ;ogical conversation. Pt has hard time understanding certain concepts, but pt is understanding. Pt expressed her readiness to leave. Pt informed that she needs a plan and a place to go .   A: Pt was offered support and encouragement. Pt was given scheduled medications. Pt was encourage to attend groups. Q 15 minute checks were done for safety.   R:Pt attends groups and interacts well with peers and staff. Pt is taking medication. Pt receptive to treatment and safety maintained on unit.

## 2014-10-28 NOTE — Progress Notes (Signed)
D: Patient remains to have altered thought process , aware of going to group home  tomorrow stated slept good last night .Stated appetite is good and energy level  Is normal. Stated concentration is good . Stated on Depression scale , hopeless and anxiety .( low 0-10 high) Denies suicidal  homicidal ideations  .  No auditory hallucinations  No pain concerns . Appropriate ADL'S. Interacting with peers and staff.  A: Encourage patient participation with unit programming . Instruction  Given on  Medication , verbalize understanding. R: Voice no other concerns. Staff continue to monitor

## 2014-10-28 NOTE — Progress Notes (Signed)
Recreation Therapy Notes  Date: 08.15.16 Time: 3:00 pm Location: Craft Room   Group Topic: Self-expression    Goal Area(s) Addresses:  Patient will use art as a means of self-expression. Patient will verbalize at least one emotion they are feeling today.  Behavioral Response: Did not attend  Intervention: Bottled Up  Activity: Patients were instructed to draw a bottle the way they saw themselves. Patients were instructed to write the emotions they were experiencing inside the bottle.  Education: LRT educated patients on different forms of self-expression.  Education Outcome: Patient did not attend group.  Clinical Observations/Feedback: Patient did not attend group.  Jacquelynn Cree, LRT/CTRS 10/28/2014 4:48 PM

## 2014-10-29 NOTE — Progress Notes (Signed)
[facility] H&P Discharge Summary faxed to [facility] for hospital follow-up °

## 2014-10-29 NOTE — Clinical Social Work Note (Signed)
CSW called DSS Guardianshp services to update Gloria Frank [phone] that patient discharge to group home owned by [facility] [phone] and faxed questionaire to DSS to help with guradianship process.  CSW called patients cousin [name] [phone] to notify of patient discharge but no answer and left message for him to call CSW back.

## 2014-10-29 NOTE — Progress Notes (Signed)
D:Patient aware of discharge this shift . Patient returning home . Patient received all belonging locked up . Patient denies  Suicidal  And homicidal ideations  .  A: Writer instructed on discharge criteria  . Informed of 7 day supply  Of medication  FL2 and prescriptions  given to group home staff . Aware  Of follow up appointment . R: Patient left unit with no questions  Or concerns  With group home staff Francena Hanly.

## 2014-10-29 NOTE — [facility] Suicide Risk Assessment (Signed)
[facility] INPATIENT:  Family/Significant Other Suicide Prevention Education  Suicide Prevention Education:  Contact Attempts: [name] (cousin) [phone] has been identified by the patient as the family member/significant other with whom the patient will be residing, and identified as the person(s) who will aid the patient in the event of a mental health crisis.  With written consent from the patient, two attempts were made to provide suicide prevention education, prior to and/or following the patient's discharge.  We were unsuccessful in providing suicide prevention education.  A suicide education pamphlet was given to the patient to share with family/significant other.  Date and time of first attempt:10/29/14 10:00am Date and time of second attempt:10/29/14 12:10am  Lulu Riding, MSW, LCSWA 10/29/2014, 12:10 PM

## 2014-10-29 NOTE — Progress Notes (Signed)
  [facility] Adult Case Management Discharge Plan :  Will you be returning to the same living situation after discharge:  No. Patient is referred to DSS for guardianship in [place] and is palced at [facility] with Francena Hanly [phone] At discharge, do you have transportation home?: No. Group home will provide transportation at discharge Do you have the ability to pay for your medications: No. [facility] and has 7 day supply of meds at discharge until her Medicaid is reinstated.  Release of information consent forms completed and in the chart;  Patient's signature needed at discharge.  Patient to Follow up at: Follow-up Information    Follow up with [facility]. Go on 11/04/2014.   Why:  For follow-up care patient appointment Monday 11/04/14 at 7:00; [facility] are MWF 8am-3pm; CALL HAR[facility] AT [facility] DAY OF [facility] DISCHARGE AT [phone]   Contact information:   [address] [place], [place] Ph [phone] Fax [phone]      Patient denies SI/HI: Yes,  patient denies SI/HI    Safety Planning and Suicide Prevention discussed: Yes,  SPE discussed with patient and attempted to call patient's uncle and left vm to return call     Has patient been referred to the Quitline?: N/A patient is not a smoker  Lulu Riding, MSW, LCSWA 10/29/2014, 12:11 PM

## 2014-10-29 NOTE — Progress Notes (Signed)
D: Patient denies SI/HI/AVH.  Patient affect is depressed and suspicious.  Patient did NOT attend evening group. Patient visible on the milieu. No distress noted. A: Support and encouragement offered. Scheduled medications given to pt. Q 15 min checks continued for patient safety. R: Patient receptive. Patient remains safe on the unit.

## 2014-10-29 NOTE — Discharge Summary (Addendum)
Physician Discharge Summary Note  Patient:  Gloria Frank is an 44 y.o., female MRN:  [id number] DOB:  1970/09/17 Patient phone:  [phone] (home)  Patient address:   [address] [place] [address],  Total Time spent with patient: 30 minutes  Date of Admission:  09/23/2014 Date of Discharge: 10/29/14  Reason for Admission:  Bizarre behavior and disorganization  Principal Problem: Schizophrenia, unspecified type Discharge Diagnoses: Patient Active Problem List   Diagnosis Date Noted  . Schizophrenia, unspecified type [F20.9] 09/24/2014  . High blood pressure [I10] 09/23/2014    Musculoskeletal: Strength & Muscle Tone: within normal limits Gait & Station: normal Patient leans: N/A  Psychiatric Specialty Exam: Physical Exam  Review of Systems  Constitutional: Negative.   HENT: Positive for congestion.   Eyes: Negative.   Respiratory: Negative.   Cardiovascular: Negative.   Gastrointestinal: Negative.   Genitourinary: Negative.   Musculoskeletal: Negative.   Skin: Negative.   Neurological: Negative.   Endo/Heme/Allergies: Negative.   Psychiatric/Behavioral: Negative.     Blood pressure 133/85, pulse 69, temperature 97.9 F (36.6 C), temperature source Oral, resp. rate 20, height  (1.626 m), weight 79.379 kg (175 lb), SpO2 97 %.Body mass index is 30.02 kg/(m^2).  General Appearance: Bizarre  Eye Contact::  Good  Speech:  Normal Rate  Volume:  Normal  Mood:  Euthymic  Affect:  Congruent  Thought Process:  Disorganized  Orientation:  Full (Time, Place, and Person)  Thought Content:  Some delusions still present. No evidence of hallucinations. No evidence of SI or HI  Suicidal Thoughts:  No  Homicidal Thoughts:  No  Memory:  Immediate;   Good  Judgement:  Poor  Insight:  Lacking  Psychomotor Activity:  Normal  Concentration:  NA  Recall:  NA  Fund of Knowledge:Fair  Language: Good  Akathisia:  No  Handed:    AIMS (if indicated):      Assets:  Financial Resources/Insurance Housing Physical Health  ADL's:  Intact  Cognition: WNL  Sleep:  Number of Hours: 6   History of Present Illness: Gloria Frank is a 44 y.o. female with no known medical history who presented in the custody of the police with an IVC in place (7/09). They IVC'd her for bizarre and paranoid statements and behavior.She states that people have been following her, that she is a [place] pig for the government, her grandmother is missing, she does not participate in recycling programs. When ER doctor attempted to listen to her lungs, she pointed out that her lungs are here, pointing at her right shoulder.   Per the ER psychiatrist the patient was not even able to give a reliable description of the events leading to hospitalization. She told a rambling story about how she had been living in [place] and then living in [place]. Shecouldn't describe why police were approaching her or what transpired when they approached her. Every time she started to describe the situation and she derailed into bizarre nonsensical statements. The patient denied abusing any alcohol or drugs. She claimed she is not taking any prescription medicine.   Per records patient was seen in [facility] emergency department back in 2012 due to psychosis. Labs were available, she was not positive for any illicit substances. Also her basic metabolic panel and CBC were within the normal limits. She reported being treated with risperidone.  Today patient was interviewed. She is a very limited historian. There is frequent derailment in her thought process. She does not  answer questions about him is feeling even with significant redirection. Patient gave me the telephone number for her son and gave me permission to contact him [phone] his name is Jess Barters   Substance abuse history: She denies that she drinks or uses any street drugs and also denies smoking. Says that she's never  had a drug or alcohol problem in the past.  Current medications unknown HPI Elements: Quality: Disorganized psychosis with evidence of paranoia and delusions. Severity: Severe and impairing her ability to take care of herself. Timing: Unknown probably chronic. Duration: Still going strong after being in the emergency room for a day. Context: Unknown context with probably noncompliant with treatment.  Total Time spent with patient: 1 hour   Past psychiatric history: Patient denies that she's ever been in a psychiatric hospital before but when I named several of them she clearly recognize them. Old records show that she's had at least one prior visit to an emergency room in our system at which time she was also identified as having psychotic symptoms. It was mentioned them that she had been on Risperdal in the past. Patient denies any history of suicide attempts or violent behavior. Won't name any other medications that she has taken. Patient claims that she has been diagnosed with depression in the past.   Past Medical History: Patient denies any significant ongoing medical problems but acutely says that she has a sinus infection. It is causing nasal congestion. Denies that she is on any medication.  Family History: not known  Social History: It is unclear where she might be living. She does me that she has been traveling around for several months. This may or may not be true. She won't tell me an address where she lives. Mentions both [place] and [place] and being here in [place]. She won't tell me anything about specific relatives that she stays in touch with. She claims to have 4 children but says they are all living in different places "scattered". Doesn't appear to be working. Unclear whether she gets disability. Her jaundice child is 62 years old, her daughter. She lives with him the patient's ex-husband in [place]. She has other 3 children were grown. The  patient states that she never divorced her husband but she tells me she is now remarried. Patient mentioned that she has been at the shelter in [place] and also in jail in that same area however she stated that this is being several years ago. I when I ask her about disability she says she does not know whether she is on disability or not she thinks she gets money from a payroll but could not tell me whether she is working or not. History  Alcohol Use No    History  Drug Use No    History   Social History  . Marital Status: Married    Spouse Name: N/A  . Number of Children: N/A  . Years of Education: N/A   Social History Main Topics  . Smoking status: Never Smoker   . Smokeless tobacco: Not on file  . Alcohol Use: No  . Drug Use: No  . Sexual Activity: Not on file         Hospital Course:   Ms. Greenhouse is a 44 year old with diagnosis of schizophrenia who presented to our emergency department under petition due to bizarre behavior and paranoia. Patient is a poor historian at this time and there is no collateral information. Per records looks the patient  was in our system backing 2002 for psychosis in [facility] emergency department.  Schizophrenia: Continue risperdal Mtab 8 mg nightly. Patient has not been willing to take any other medications. Patient has declined from receiving a long acting injectable. Patient has poor insight into her condition and continues to deny having a mental illness.  As far as side effects patient has developed nasal congestion which is likely a side effect from Risperdal as it did not improve with Flonase or antihistaminic. I recommend in the future to attend to switch her anti-psychotic to a long-acting injectable.  Insomnia: Continue Ativan but will decrease to 0.25 per her request due to am drowsiness.   Hypertension: Continue atenolol 12.5 mg a day as patient is tachycardic. 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 Patient has been started on amantadine 100 mg by mouth twice a day which she has complied with.  Nasal congestion: continue ocean spray, Claritin and today I will start the patient on Flonase.  Labs: TSH and hemoglobin A1c and lipid panel were within the normal limits. HIV and RPR nonreactive. Vitamin B12 was within the normal limits. Compressive metabolic panel and CBC and basically within the normal limits. Prolactin level is 169. Patient appears to be asymptomatic however is difficult to verify this as she is very suspicious and uncooperative at times to questioning.  Collateral information: Patient's son and ex-husband have been contacted. They are not close. The patient. They do not have any information about her. Her son is states that he was removed from her custody at a young age due to her issues with schizophrenia. The son stated the last he knew was that she was living somewhere in [place]. Social worker contacted the patient's causing him [name]. He reported that the patient has been missing for 3 months. He reports that she was living in a apartment in [facility]. On 8/11 SW was able to find that pt was receiving SSI but it has been stopped. SSI will be only reinstated with a payee. Social worker also attempted to contact the patient's uncle in [place] : [phone]. Messages have been left in his voicemail. We did not receive a call back.  Discharge planning: Social worker was able to arrange for discharge into a group home in [place]. A referral for Adult Protective Services has been made. A letter recommending guardianship has been submitted to them. Main reason for referral is the lack of support, homelessness and significant cognitive deficits due to disorganized thought  process.  During her stay in the unit the patient did not require seclusion, restraints or forced medications. There were no episodes of agitation or major behavioral problems. Patient however throughout her stay was suspicious and at times argumentative. Thought process was very disorganized but has improved over the last 2 weeks. However at times patient continues to make bizarre statements and continues to walk around the unit wearing underwear on her head saying that it prevents her from having her womb collapsed.  She had a prolonged hospitalization as he was very difficult for the social workers to obtain information regarding family support, financial resources, prior treatment as patient was  Suspicious, disorganized and paranoid this information was very hard to obtain. In addition patient had stopped receiving disability and was homeless.    Discharge Vitals:   Blood pressure 133/85, pulse 69, temperature  97.9 F (36.6 C), temperature source Oral, resp. rate 20, height 5\' 4"  (1.626 m), weight 79.379 kg (175 lb), SpO2 97 %. Body mass index is 30.02 kg/(m^2).  Lab Results:   Results for orders placed or performed during the hospital encounter of 09/23/14 (from the past 72 hour(s))  Urinalysis complete, with microscopic ([facility] only)     Status: Abnormal   Collection Time: 10/27/14  2:36 PM  Result Value Ref Range   Color, Urine YELLOW (A) YELLOW   APPearance CLEAR (A) CLEAR   Glucose, UA NEGATIVE NEGATIVE mg/dL   Bilirubin Urine NEGATIVE NEGATIVE   Ketones, ur NEGATIVE NEGATIVE mg/dL   Specific Gravity, Urine 1.025 1.005 - 1.030   Hgb urine dipstick NEGATIVE NEGATIVE   pH 5.0 5.0 - 8.0   Protein, ur NEGATIVE NEGATIVE mg/dL   Nitrite NEGATIVE NEGATIVE   Leukocytes, UA TRACE (A) NEGATIVE   RBC / HPF 0-5 0 - 5 RBC/hpf   WBC, UA 0-5 0 - 5 WBC/hpf   Bacteria, UA RARE (A) NONE SEEN   Squamous Epithelial / LPF 6-30 (A) NONE SEEN   Mucous PRESENT     Physical Findings: AIMS: Facial  and Oral Movements Muscles of Facial Expression: None, normal Lips and Perioral Area: None, normal Jaw: None, normal Tongue: None, normal,Extremity Movements Upper (arms, wrists, hands, fingers): None, normal Lower (legs, knees, ankles, toes): None, normal, Trunk Movements Neck, shoulders, hips: None, normal, Overall Severity Severity of abnormal movements (highest score from questions above): None, normal Incapacitation due to abnormal movements: None, normal Patient's awareness of abnormal movements (rate only patient's report): No Awareness, Dental Status Current problems with teeth and/or dentures?: No Does patient usually wear dentures?: No  CIWA:  CIWA-Ar Total: 0   Discharge Instructions    Diet - low sodium heart healthy    Complete by:  As directed      Increase activity slowly    Complete by:  As directed             Medication List    TAKE these medications      Indication   amantadine 100 MG capsule  Commonly known as:  SYMMETREL  Take 1 capsule (100 mg total) by mouth 2 (two) times daily.  Notes to Patient:  Prevent EPS and increase of prolactin      atenolol 25 MG tablet  Commonly known as:  TENORMIN  Take 0.5 tablets (12.5 mg total) by mouth daily.  Notes to Patient:  Blood pressure and heart rate      fluticasone 50 MCG/ACT nasal spray  Commonly known as:  FLONASE  Place 2 sprays into both nostrils daily.  Notes to Patient:  Nasal congestion      loratadine 10 MG tablet  Commonly known as:  CLARITIN  Take 1 tablet (10 mg total) by mouth daily.  Notes to Patient:  allergies      LORazepam 0.5 MG tablet  Commonly known as:  ATIVAN  Take 0.5 tablets (0.25 mg total) by mouth at bedtime.  Notes to Patient:  insomnia      risperiDONE 4 MG disintegrating tablet  Commonly known as:  RISPERDAL M-TABS  Take 2 tablets (8 mg total) by mouth at bedtime.  Notes to Patient:  schizophrenia      sodium chloride 0.65 % Soln nasal spray  Commonly known as:   OCEAN  Place 1 spray into both nostrils as needed for congestion.  Notes to Patient:  Nasal congestion  Follow-up recommendations:  Other:  Follow-up prolactin levels. Consider switching the patient to a long acting injectable on by psychotic  Comments:  30 minutes  Total Discharge Time: 30 minutes  Signed: Jimmy Footman 10/29/2014, 8:42 AM

## 2014-10-29 NOTE — [facility] Suicide Risk Assessment (Signed)
[facility] Discharge Suicide Risk Assessment   Demographic Factors:  Low socioeconomic status  Total Time spent with patient: 30 minutes    Psychiatric Specialty Exam: Physical Exam  ROS                                                            Has this patient used any form of tobacco in the last 30 days? (Cigarettes, Smokeless Tobacco, Cigars, and/or Pipes) No  Mental Status Per Nursing Assessment::   On Admission:     Current Mental Status by Physician: Calm, pleasant and cooperative. Mood is euthymic. Denies SI or HI or auditory or visual hallucinations. Her level of suspiciousness has decreased. Level of disorganization also has decreased significantly. No episodes of agitation or aggression. No evidence of the patient is interacting to internal stimuli. She will be discharge to a group home. I let her for a PES requesting guardianship has been sent to [facility] [place]  Loss Factors: Financial problems/change in socioeconomic status  Historical Factors: NA  Risk Reduction Factors:   Sense of responsibility to family  Continued Clinical Symptoms:  Schizophrenia:   Paranoid or undifferentiated type Previous Psychiatric Diagnoses and Treatments  Cognitive Features That Contribute To Risk:  Closed-mindedness    Suicide Risk:  Minimal: No identifiable suicidal ideation.  Patients presenting with no risk factors but with morbid ruminations; may be classified as minimal risk based on the severity of the depressive symptoms  Principal Problem: Schizophrenia, unspecified type Discharge Diagnoses:  Patient Active Problem List   Diagnosis Date Noted  . Schizophrenia, unspecified type [F20.9] 09/24/2014  . High blood pressure [I10] 09/23/2014        Is patient on multiple antipsychotic therapies at discharge:  No   Has Patient had three or more failed trials of antipsychotic monotherapy by history:  No  Recommended  Plan for Multiple Antipsychotic Therapies: NA    Jimmy Footman 10/29/2014, 8:39 AM

## 2014-11-06 ENCOUNTER — Ambulatory Visit: Payer: Self-pay

## 2014-11-07 ENCOUNTER — Encounter (HOSPITAL_COMMUNITY): Payer: Self-pay

## 2014-11-07 ENCOUNTER — Emergency Department (HOSPITAL_COMMUNITY): Payer: Self-pay

## 2014-11-07 ENCOUNTER — Emergency Department (HOSPITAL_COMMUNITY)
Admission: EM | Admit: 2014-11-07 | Discharge: 2014-11-08 | Disposition: A | Discharge status: Home / Self Care | Payer: Self-pay | Attending: Emergency Medicine | Admitting: Emergency Medicine

## 2014-11-07 DIAGNOSIS — F22 Delusional disorders: Secondary | ICD-10-CM | POA: Insufficient documentation

## 2014-11-07 DIAGNOSIS — Z7951 Long term (current) use of inhaled steroids: Secondary | ICD-10-CM | POA: Insufficient documentation

## 2014-11-07 DIAGNOSIS — Z79899 Other long term (current) drug therapy: Secondary | ICD-10-CM | POA: Insufficient documentation

## 2014-11-07 DIAGNOSIS — Z3202 Encounter for pregnancy test, result negative: Secondary | ICD-10-CM | POA: Insufficient documentation

## 2014-11-07 LAB — URINALYSIS, ROUTINE W REFLEX MICROSCOPIC
Bilirubin Urine: NEGATIVE
Glucose, UA: NEGATIVE mg/dL
HGB URINE DIPSTICK: NEGATIVE
Ketones, ur: 15 mg/dL — AB
Leukocytes, UA: NEGATIVE
Nitrite: NEGATIVE
Protein, ur: NEGATIVE mg/dL
SPECIFIC GRAVITY, URINE: 1.035 — AB (ref 1.005–1.030)
Urobilinogen, UA: 1 mg/dL (ref 0.0–1.0)
pH: 5.5 (ref 5.0–8.0)

## 2014-11-07 LAB — COMPREHENSIVE METABOLIC PANEL
ALT: 12 U/L — AB (ref 14–54)
ANION GAP: 5 (ref 5–15)
AST: 15 U/L (ref 15–41)
Albumin: 4.1 g/dL (ref 3.5–5.0)
Alkaline Phosphatase: 72 U/L (ref 38–126)
BUN: 11 mg/dL (ref 6–20)
CHLORIDE: 107 mmol/L (ref 101–111)
CO2: 27 mmol/L (ref 22–32)
CREATININE: 0.64 mg/dL (ref 0.44–1.00)
Calcium: 8.7 mg/dL — ABNORMAL LOW (ref 8.9–10.3)
GFR calc non Af Amer: >60 mL/min (ref >60)
Glucose, Bld: 90 mg/dL (ref 65–99)
POTASSIUM: 3 mmol/L — AB (ref 3.5–5.1)
SODIUM: 139 mmol/L (ref 135–145)
Total Bilirubin: 0.2 mg/dL — ABNORMAL LOW (ref 0.3–1.2)
Total Protein: 7.6 g/dL (ref 6.5–8.1)

## 2014-11-07 LAB — CBC
HCT: 34.7 % — ABNORMAL LOW (ref 36.0–46.0)
Hemoglobin: 11.4 g/dL — ABNORMAL LOW (ref 12.0–15.0)
MCH: 30.8 pg (ref 26.0–34.0)
MCHC: 32.9 g/dL (ref 30.0–36.0)
MCV: 93.8 fL (ref 78.0–100.0)
PLATELETS: 409 10*3/uL — AB (ref 150–400)
RBC: 3.7 MIL/uL — AB (ref 3.87–5.11)
RDW: 14.8 % (ref 11.5–15.5)
WBC: 6.5 10*3/uL (ref 4.0–10.5)

## 2014-11-07 LAB — RAPID URINE DRUG SCREEN, HOSP PERFORMED
AMPHETAMINES: NOT DETECTED
Barbiturates: NOT DETECTED
Benzodiazepines: NOT DETECTED
Cocaine: NOT DETECTED
OPIATES: NOT DETECTED
TETRAHYDROCANNABINOL: NOT DETECTED

## 2014-11-07 LAB — ACETAMINOPHEN LEVEL

## 2014-11-07 LAB — SALICYLATE LEVEL

## 2014-11-07 LAB — PREGNANCY, URINE: Preg Test, Ur: NEGATIVE

## 2014-11-07 LAB — ETHANOL

## 2014-11-07 MED ORDER — ATENOLOL 12.5 MG HALF TABLET: 12.5000 mg | ORAL_TABLET | Freq: Every day | ORAL | Status: DC

## 2014-11-07 MED ORDER — LORATADINE 10 MG PO TABS
10.0000 mg | ORAL_TABLET | Freq: Every day | ORAL | Status: DC
Start: 2014-11-07 — End: 2014-11-08

## 2014-11-07 MED ORDER — POTASSIUM CHLORIDE CRYS ER 20 MEQ PO TBCR
40.0000 meq | EXTENDED_RELEASE_TABLET | Freq: Once | ORAL | Status: AC
  Administered 2014-11-07: 40 meq via ORAL
  Filled 2014-11-07: qty 2

## 2014-11-07 MED ORDER — AMANTADINE HCL 100 MG PO CAPS: 100.0000 mg | ORAL_CAPSULE | Freq: Two times a day (BID) | ORAL | Status: DC

## 2014-11-07 MED ORDER — LORAZEPAM 0.5 MG PO TABS: 0.2500 mg | ORAL_TABLET | Freq: Every day | ORAL | Status: DC

## 2014-11-07 MED ORDER — SALINE SPRAY 0.65 % NA SOLN
1.0000 | NASAL | Status: DC | PRN
  Filled 2014-11-07: qty 44

## 2014-11-07 MED ORDER — RISPERIDONE 2 MG PO TBDP: 8.0000 mg | ORAL_TABLET | Freq: Every day | ORAL | Status: DC

## 2014-11-07 MED ORDER — FLUTICASONE PROPIONATE 50 MCG/ACT NA SUSP
2.0000 | Freq: Every day | NASAL | Status: DC
  Filled 2014-11-07: qty 16

## 2014-11-07 NOTE — Progress Notes (Signed)
[facility] consulted social work regarding home environment.

## 2014-11-07 NOTE — ED Notes (Addendum)
Patient called EMS to state that she has machines in her chest and she needs a chest xray to get them out. She also states that she needs her neck checked from a 44 year old neck surgery and there may be something left in there. Patient was discharged from [facility] in July for a psych admission. Patient's reason for her visit changes with every question. Patient is very dishelved in appearance. History of paranoid schizophrenia.

## 2014-11-07 NOTE — ED Notes (Signed)
Pt given food

## 2014-11-07 NOTE — BH Assessment (Addendum)
Tele Assessment Note   Gloria Frank is an African-American, 44 y.o. female with a hx of paranoid schizophrenia. Pt presents to [facility] voluntarily due to reported "elevated blood pressure" and "skipped heart beats". Pt is delusional and believes she has "machines" in her chest and that "a nurse threw away all of my medical records". Pt is very suspicious of the counselor and is argumentative and hostile. Speech is coherent but thought process is irrelevant. During interview, pt is extremely guarded and repeatedly asks what is being written down about her on the assessment form. She states that the counselor should already know all of her medical and psych hx, so there is no need for an evaluation. She also wants to know what the counselor will tell the MD about her. Pt was just released from [facility] on 10/29/14 after a 5 week admission. Pt was discharged to [facility] but left that group home for unknown reasons. She states that she was "discharged" from the group home on Monday and has been staying with her Everette Rank since then. Pt states that she wants her own housing free of charge. She denies psychotic sx but acknowledges that she does suffer from depression. Per chart review, pt has had several IVC admissions due to psychosis and threatening to harm police officers. Pt has been evaluated or admitted to [facility] and possibly [facility]. Pt denies A/VH, SI, HI, or self-harming behaviors. She denies alcohol and drug use. BAL is insignificant and UDS is clear. Pt wants to be d/c. Pt is delusional but is not deemed to be a threat to self or others.  Disposition: Per Hulan Fess, NP, and Dr Ethelda Chick, pt does not meet inpt tx criteria at this time. Pt to be discharged home. CSW provided referrals.  Axis I: 295.90 Schizophrenia Axis II: Deferred Axis III: History reviewed. No pertinent past medical history. Axis IV: economic problems, housing problems, other psychosocial or  environmental problems, problems related to social environment, problems with access to health care services and problems with primary support group Axis V: 21-30 behavior considerably influenced by delusions or hallucinations OR serious impairment in judgment, communication OR inability to function in almost all areas  Past Medical History: History reviewed. No pertinent past medical history.  History reviewed. No pertinent past surgical history.  Family History: History reviewed. No pertinent family history.  Social History:  reports that she has never smoked. She does not have any smokeless tobacco history on file. She reports that she does not drink alcohol or use illicit drugs.  Additional Social History:  Alcohol / Drug Use Pain Medications: See PTA List Prescriptions: See PTA List Over the Counter: See PTA List History of alcohol / drug use?: No history of alcohol / drug abuse  CIWA: CIWA-Ar BP: 133/81 mmHg Pulse Rate: 71 COWS:    PATIENT STRENGTHS: (choose at least two) Average or above average intelligence Physical Health  Allergies: No Known Allergies  Home Medications:  (Not in a hospital admission)  OB/GYN Status:  Patient's last menstrual period was 09/29/2014.  General Assessment Data [facility] of Assessment: [facility] TTS Assessment: In system Is this a Tele or Face-to-Face Assessment?: Face-to-Face Is this an Initial Assessment or a Re-assessment for this encounter?: Initial Assessment Marital status: Single Is patient pregnant?: No Pregnancy Status: No Living Arrangements: Other (Comment) Can pt return to current living arrangement?: No Admission Status: Voluntary Is patient capable of signing voluntary admission?: Yes Referral Source: Self/Family/Friend Insurance type: None  Crisis Care Plan Living Arrangements: Other (Comment) Name of Psychiatrist: None Name of Therapist: None  Education Status Is patient currently in school?: No Current  Grade: na Highest grade of school patient has completed: na Name of school: na Contact person: na  Risk to self with the past 6 months Suicidal Ideation: No Has patient been a risk to self within the past 6 months prior to admission? : No Suicidal Intent: No Has patient had any suicidal intent within the past 6 months prior to admission? : No Is patient at risk for suicide?: No Suicidal Plan?: No Has patient had any suicidal plan within the past 6 months prior to admission? : No Access to Means: No What has been your use of drugs/alcohol within the last 12 months?: None Previous Attempts/Gestures:  (UTA) How many times?:  (UTA) Other Self Harm Risks: Psychosis Triggers for Past Attempts:  (n/a) Intentional Self Injurious Behavior: None Family Suicide History: Unknown Recent stressful life event(s): Financial Problems Persecutory voices/beliefs?: Yes Depression: Yes Depression Symptoms: Insomnia, Isolating, Feeling angry/irritable Substance abuse history and/or treatment for substance abuse?: No Suicide prevention information given to non-admitted patients: Not applicable  Risk to Others within the past 6 months Homicidal Ideation: No Does patient have any lifetime risk of violence toward others beyond the six months prior to admission? : Yes (comment) (Hx of threatening [employer]) Thoughts of Harm to Others: No Current Homicidal Intent: No Current Homicidal Plan: No Access to Homicidal Means: No Identified Victim: n/a History of harm to others?:  (UTA) Assessment of Violence: On admission (Pt not violent but is hostile and paranoid) Violent Behavior Description: Hx of threats to [employer], Hostile with counselor during intake Does patient have access to weapons?: No Criminal Charges Pending?: No Does patient have a court date: No Is patient on probation?: No  Psychosis Hallucinations: None noted Delusions: Persecutory, Somatic  Mental Status Report Appearance/Hygiene:  Disheveled Eye Contact: Good Motor Activity: Agitation Speech: Aggressive, Argumentative Level of Consciousness: Irritable Mood: Suspicious, Angry Affect: Irritable Anxiety Level: Moderate Thought Processes: Irrelevant Judgement: Impaired Orientation: Person, Place Obsessive Compulsive Thoughts/Behaviors: None  Cognitive Functioning Concentration: Fair Memory: Unable to Assess IQ: Average Insight: Poor Impulse Control: Poor Appetite: Good Weight Loss: 0 Weight Gain: 0 Sleep: No Change Total Hours of Sleep:  (UTA) Vegetative Symptoms: Decreased grooming  ADLScreening [facility] Assessment Services) Patient's cognitive ability adequate to safely complete daily activities?: Yes Patient able to express need for assistance with ADLs?: Yes Independently performs ADLs?: Yes (appropriate for developmental age)  Prior Inpatient Therapy Prior Inpatient Therapy: Yes Prior Therapy Dates: 09/21/14 - 10/29/14 Prior Therapy Facilty/Provider(s): [facility] - Pt has also had admissions at [facility] Reason for Treatment: Schizophrenia  Prior Outpatient Therapy Prior Outpatient Therapy: Yes Prior Therapy Dates: UTA Prior Therapy Facilty/Provider(s): UTA Reason for Treatment: Depression, Schizophrenia Does patient have an ACCT team?: No Does patient have Intensive In-House Services?  : No Does patient have [facility] services? : No Does patient have P4CC services?: No  ADL Screening (condition at time of admission) Patient's cognitive ability adequate to safely complete daily activities?: Yes Is the patient deaf or have difficulty hearing?: No Does the patient have difficulty seeing, even when wearing glasses/contacts?: No Does the patient have difficulty concentrating, remembering, or making decisions?: No Patient able to express need for assistance with ADLs?: Yes Does the patient have difficulty dressing or bathing?: No Independently performs ADLs?: Yes (appropriate for developmental age) Does the  patient have difficulty walking or climbing stairs?: No Weakness of Legs:  None Weakness of Arms/Hands: None  Home Assistive Devices/Equipment Home Assistive Devices/Equipment: None    Abuse/Neglect Assessment (Assessment to be complete while patient is alone) Physical Abuse: Denies Verbal Abuse: Denies Sexual Abuse: Denies Exploitation of patient/patient's resources: Denies Self-Neglect: Denies Values / Beliefs Cultural Requests During Hospitalization: None Spiritual Requests During Hospitalization: None   Advance Directives (For Healthcare) Does patient have an advance directive?: No Would patient like information on creating an advanced directive?: No - patient declined information    Additional Information 1:1 In Past 12 Months?: No CIRT Risk: Yes Elopement Risk: Yes Does patient have medical clearance?: Yes     Disposition: Per Hulan Fess, NP, and Dr Ethelda Chick, pt does not meet inpt tx criteria at this time. Pt to be discharged home. CSW provided referrals. Disposition Initial Assessment Completed for this Encounter: Yes Disposition of Patient: Other dispositions Other disposition(s): Other (Comment) (Pt to be re-evaluated by psychiatry in the AM)  Cyndie Mull, [facility]  11/07/2014 11:44 PM

## 2014-11-07 NOTE — ED Notes (Signed)
Psychiatry PA in to talk with patinet

## 2014-11-07 NOTE — Progress Notes (Signed)
CSW was consulted by EDP to speak with patient about regarding support system and living arrangement.   CSW met with patient at bedside. There was no family present. Patient informed CSW that she was "discharged" from [facility] this Monday. CSW asked patient where she has been living since being away from the [facility]. According to patient, she has been staying with her Burnadette Pop in [place]. Patient states that she that her Barbaraann Rondo says she can come back to his home if she has no other place to go.   Patient states that she completes her ADL's independently CSW reached out to [employer] [phone]. However, he did not answer the phone. CSW left a message for Uncle to call back.   Patient informed CSW that her primary support is her uncle and her significant other who lives in [place].  CSW reached out to [place]. However, she was no present. CSW will continue to follow up.  Willette Brace [phone] ED CSW 11/07/2014 10:45 PM

## 2014-11-07 NOTE — ED Notes (Signed)
[place] from Care management in talking to patient

## 2014-11-07 NOTE — ED Notes (Signed)
Bed: ZO10 Expected date:  Expected time:  Means of arrival:  Comments: EMS 44 yo female/"machine around my heart"-[facility] hx

## 2014-11-07 NOTE — ED Notes (Signed)
Pt has now changed back into her own clothes and thinks she is not staying

## 2014-11-07 NOTE — ED Notes (Signed)
Returned from xray

## 2014-11-07 NOTE — ED Notes (Signed)
TTS at bedside. 

## 2014-11-07 NOTE — Progress Notes (Signed)
Per chart review, patient was discharged from [facility] on the 08/16.  APS report was filed, DSS contacted for guardianship of patient, patient discharged with seven day supply of medications until her Medicaid was reinstated.  Patient was discharged to a group home in [place] called [facility] with contact Francena Hanly [phone].  Per chart review patient was to follow up at [facility] in [place] [phone] with contact Lorella Nimrod [phone].  [facility] called ED registration to confirm Medicaid.  Patient does not have Medicaid at this time.  EDSW to contact Francena Hanly at group home.

## 2014-11-07 NOTE — ED Notes (Signed)
Patient transported to X-ray 

## 2014-11-07 NOTE — ED Provider Notes (Addendum)
CSN: [id number]     Arrival date & time 11/07/14  1852 History   First MD Initiated Contact with Patient 11/07/14 1857     No chief complaint on file. level V caveat patient delusional Chief complaint skipped heartbeats  (Consider location/radiation/quality/duration/timing/severity/associated sxs/prior Treatment) HPIpatient is concerned that she has an electronic and her chest and complains of "skipped heart beats"for several years. She came by EMS today. She denies wanting to harm herself or or others. She presents today as she was concerned about skipped heart beats which have been going on for several years.no treatment prior to coming here.  History reviewed. No pertinent past medical history. schizophrenia History reviewed. No pertinent past surgical history. History reviewed. No pertinent family history. Social History  Substance Use Topics  . Smoking status: Never Smoker   . Smokeless tobacco: None  . Alcohol Use: No  no alcohol no drug use OB History    No data available     Review of Systems  Unable to perform ROS: Psychiatric disorder  Cardiovascular: Positive for palpitations.      Allergies  Review of patient's allergies indicates no known allergies.  Home Medications   Prior to Admission medications   Medication Sig Start Date End Date Taking? Authorizing Provider  amantadine (SYMMETREL) 100 MG capsule Take 1 capsule (100 mg total) by mouth 2 (two) times daily. 10/28/14   Jimmy Footman, MD  atenolol (TENORMIN) 25 MG tablet Take 0.5 tablets (12.5 mg total) by mouth daily. 10/28/14   Jimmy Footman, MD  fluticasone (FLONASE) 50 MCG/ACT nasal spray Place 2 sprays into both nostrils daily. 10/28/14   Jimmy Footman, MD  loratadine (CLARITIN) 10 MG tablet Take 1 tablet (10 mg total) by mouth daily. 10/28/14   Jimmy Footman, MD  LORazepam (ATIVAN) 0.5 MG tablet Take 0.5 tablets (0.25 mg total) by mouth at bedtime. 10/28/14    Jimmy Footman, MD  risperiDONE (RISPERDAL M-TABS) 4 MG disintegrating tablet Take 2 tablets (8 mg total) by mouth at bedtime. 10/28/14   Jimmy Footman, MD  sodium chloride (OCEAN) 0.65 % SOLN nasal spray Place 1 spray into both nostrils as needed for congestion. 10/28/14   Jimmy Footman, MD   BP 137/81 mmHg  Pulse 72  Temp(Src) 97.5 F (36.4 C) (Oral)  Resp 20  SpO2 97%  LMP  (LMP Unknown) Physical Exam  Constitutional: She is oriented to person, place, and time. She appears well-developed and well-nourished.  HENT:  Head: Normocephalic and atraumatic.  Eyes: Conjunctivae are normal. Pupils are equal, round, and reactive to light.  Neck: Neck supple. No tracheal deviation present. No thyromegaly present.  Cardiovascular: Normal rate and regular rhythm.   No murmur heard. Pulmonary/Chest: Effort normal and breath sounds normal.  Abdominal: Soft. Bowel sounds are normal. She exhibits no distension. There is no tenderness.  Musculoskeletal: Normal range of motion. She exhibits no edema or tenderness.  Neurological: She is alert and oriented to person, place, and time. No cranial nerve deficit. Coordination normal.  Gait normal  Skin: Skin is warm and dry. No rash noted.  Psychiatric: She has a normal mood and affect.  Nursing note and vitals reviewed.   ED Course  Procedures (including critical care time) Labs Review Labs Reviewed  COMPREHENSIVE METABOLIC PANEL  ETHANOL  SALICYLATE LEVEL  ACETAMINOPHEN LEVEL  CBC  URINE RAPID DRUG SCREEN, HOSP PERFORMED    Imaging Review No results found. I have personally reviewed and evaluated these images and lab results as part of my medical  decision-making.   EKG Interpretation None      Date: 11/07/2014  Rate: 70  Rhythm: normal sinus rhythm  QRS Axis: normal  Intervals: normal  ST/T Wave abnormalities: normal  Conduction Disutrbances: none  Narrative Interpretation: unremarkable Chest xray  viewed by me  Results for orders placed or performed during the hospital encounter of 11/07/14  Comprehensive metabolic panel  Result Value Ref Range   Sodium 139 135 - 145 mmol/L   Potassium 3.0 (L) 3.5 - 5.1 mmol/L   Chloride 107 101 - 111 mmol/L   CO2 27 22 - 32 mmol/L   Glucose, Bld 90 65 - 99 mg/dL   BUN 11 6 - 20 mg/dL   Creatinine, Ser 1.61 0.44 - 1.00 mg/dL   Calcium 8.7 (L) 8.9 - 10.3 mg/dL   Total Protein 7.6 6.5 - 8.1 g/dL   Albumin 4.1 3.5 - 5.0 g/dL   AST 15 15 - 41 U/L   ALT 12 (L) 14 - 54 U/L   Alkaline Phosphatase 72 38 - 126 U/L   Total Bilirubin 0.2 (L) 0.3 - 1.2 mg/dL   GFR calc non Af Amer >60 >60 mL/min   GFR calc Af Amer >60 >60 mL/min   Anion gap 5 5 - 15  Ethanol (ETOH)  Result Value Ref Range   Alcohol, Ethyl (B) <5 <5 mg/dL  Salicylate level  Result Value Ref Range   Salicylate Lvl <4.0 2.8 - 30.0 mg/dL  Acetaminophen level  Result Value Ref Range   Acetaminophen (Tylenol), Serum <10 (L) 10 - 30 ug/mL  CBC  Result Value Ref Range   WBC 6.5 4.0 - 10.5 K/uL   RBC 3.70 (L) 3.87 - 5.11 MIL/uL   Hemoglobin 11.4 (L) 12.0 - 15.0 g/dL   HCT 09.6 (L) 04.5 - 40.9 %   MCV 93.8 78.0 - 100.0 fL   MCH 30.8 26.0 - 34.0 pg   MCHC 32.9 30.0 - 36.0 g/dL   RDW 81.1 91.4 - 78.2 %   Platelets 409 (H) 150 - 400 K/uL  Urine rapid drug screen (hosp performed) (Not at [facility])  Result Value Ref Range   Opiates NONE DETECTED NONE DETECTED   Cocaine NONE DETECTED NONE DETECTED   Benzodiazepines NONE DETECTED NONE DETECTED   Amphetamines NONE DETECTED NONE DETECTED   Tetrahydrocannabinol NONE DETECTED NONE DETECTED   Barbiturates NONE DETECTED NONE DETECTED  Urinalysis, Routine w reflex microscopic (not at [facility])  Result Value Ref Range   Color, Urine YELLOW YELLOW   APPearance CLOUDY (A) CLEAR   Specific Gravity, Urine 1.035 (H) 1.005 - 1.030   pH 5.5 5.0 - 8.0   Glucose, UA NEGATIVE NEGATIVE mg/dL   Hgb urine dipstick NEGATIVE NEGATIVE   Bilirubin Urine NEGATIVE  NEGATIVE   Ketones, ur 15 (A) NEGATIVE mg/dL   Protein, ur NEGATIVE NEGATIVE mg/dL   Urobilinogen, UA 1.0 0.0 - 1.0 mg/dL   Nitrite NEGATIVE NEGATIVE   Leukocytes, UA NEGATIVE NEGATIVE   Dg Chest 1 View  10/28/2014   CLINICAL DATA:  History of pneumonia, evaluate for tuberculosis prior to discharge to group home tomorrow  EXAM: CHEST  1 VIEW  COMPARISON:  None.  FINDINGS: The heart size and vascular pattern are normal. Hilar and mediastinal contours are normal. Minimal scarring or atelectasis bilateral lung bases. No pleural effusion. Postsurgical change right supraclavicular region.  IMPRESSION: Minimal bibasilar scarring or atelectasis. Otherwise no acute findings.   Electronically Signed   By: Esperanza Heir M.D.   On:  10/28/2014 17:09   Dg Chest 2 View  11/07/2014   CLINICAL DATA:  Patient with palpitations. Recent psychiatric admission.  EXAM: CHEST  2 VIEW  COMPARISON:  Chest radiograph 10/28/2014  FINDINGS: Monitoring leads overlie the patient. Stable cardiac and mediastinal contours. Linear opacity right lung base, stable. Eventration left hemidiaphragm. Surgical clips project over the right lower cervical soft tissues. Mid thoracic spine degenerative changes.  IMPRESSION: No acute cardiopulmonary process. Probable right basilar atelectasis and or scarring.   Electronically Signed   By: Annia Belt M.D.   On: 11/07/2014 20:18   Urine pregnancy test  negative MDM  Patient is delusional. I've asked social work to see patient. It's unclear whether she has support system or psychiatric follow-up, after social work evaluated her tonight. She recently discharged herself from a group home. Group home staff is not available to communicate with our social worker tonight. I've asked TTS and psychiatry to evaluate patient Diagnoses #1 delusional behavior #2 hypokalemia Final diagnoses:  None        Doug Sou, MD 11/07/14 2144  Patient was evaluated by psychiatric nurse practitioner in  by TTS. I urged patient to stay to be evaluated by psychiatrist in the morning however she does not wish to stay. She has the right to refuse and I don't feel the patient is at risk of harming herself or others. She'll be referred to resource guide for mental health and for primary care physician  Doug Sou, MD 11/08/14 1610  Doug Sou, MD 11/08/14 [id number]

## 2014-11-08 NOTE — Progress Notes (Signed)
No response/ return call from pt nor uncle

## 2014-11-08 NOTE — [facility] Counselor (Signed)
NP, MD, LPC and CSW all spoke with pt individually. Pt is paranoid but is not deemed to be a threat to herself or others at this time.   Disposition: Per Hulan Fess, NP, and Dr Ethelda Chick, pt does not meet inpt tx criteria. Pt to be discharged home. CSW provided referrals. RN made aware of disposition.    Cyndie Mull, [facility] Triage Specialist

## 2014-11-08 NOTE — Progress Notes (Addendum)
[address] Rank [phone] CM left a message for uncle to call CM back related to pt Pending return call  1154 11/08/14  ED CM received consult sign off from ED PM CM  CM attempted to call pt at [phone]  [phone] but phone states pt is not receiving calls at this time and leave a voice message CM left a message for pt to call CM mobile number

## 2014-11-08 NOTE — Discharge Instructions (Signed)
Emergency Department Resource Guide Call any of the numbers on the resource guide to get a primary care physician and to get ongoing psychiatric care. If you feel that you may be a harm to yourself or someone else, call 911 immediately 1) Find a Doctor and Pay Out of Pocket Although you won't have to find out who is covered by your insurance plan, it is a good idea to ask around and get recommendations. You will then need to call the office and see if the doctor you have chosen will accept you as a new patient and what types of options they offer for patients who are self-pay. Some doctors offer discounts or will set up payment plans for their patients who do not have insurance, but you will need to ask so you aren't surprised when you get to your appointment.  2) Contact Your Local Health Department Not all health departments have doctors that can see patients for sick visits, but many do, so it is worth a call to see if yours does. If you don't know where your local health department is, you can check in your phone book. The CDC also has a tool to help you locate your state's health department, and many state websites also have listings of all of their local health departments.  3) Find a Walk-in Clinic If your illness is not likely to be very severe or complicated, you may want to try a walk in clinic. These are popping up all over the country in pharmacies, drugstores, and shopping centers. They're usually staffed by nurse practitioners or physician assistants that have been trained to treat common illnesses and complaints. They're usually fairly quick and inexpensive. However, if you have serious medical issues or chronic medical problems, these are probably not your best option.  No Primary Care Doctor: - Call Health Connect at  [phone] - they can help you locate a primary care doctor that  accepts your insurance, provides certain services, etc. - Physician Referral Service-  [phone]  Chronic Pain Problems: Organization         Address  Phone   Notes  [facility]  [phone] Patients need to be referred by their primary care doctor.   Medication Assistance: Organization         Address  Phone   Notes  [facility] Medication [facility] [address] [place] [place], [place] [address] [phone] --Must be a resident of [facility] -- Must have NO insurance coverage whatsoever (no Medicaid/ Medicare, etc.) -- The pt. MUST have a primary care doctor that directs their care regularly and follows them in the community   [facility]  [phone]   [employer]  [phone]    Agencies that provide inexpensive medical care: Organization         Address  Phone   Notes  [facility]  [phone]   [facility]    [phone]   [facility] [address] [place], [place] [address] [phone]   [facility] [address] [place]. [address] [place] [phone]   Planned Parenthood    [phone]   [facility]    [phone]   [facility]  [address], [place] Phone:  [phone], Fax:  [phone] Hours of Operation:  9 am - 6 pm, M-F.  Also accepts Medicaid/Medicare and self-pay.  [place]  Center for [place], [address], [place] Phone: [phone], Fax: [phone]. Hours of Operation:  8:30 am - 5:30 pm, M-F.  Also accepts Medicaid and self-pay.  [facility] [place] [address] [place] Phone: [phone]   [place], [place], [place] [phone], Ext. [phone] Mondays & Thursdays: 7-9 AM.  First 15 patients are seen on a first come, first serve basis.    [place] Providers:  Organization         Address  Phone   Notes  [facility] [address],  [place] [phone] Also accepts self-pay patients.  [facility] [id number] [place], [place]  [phone]   [place], [address], [place] [phone]   [facility] [address] [place] [phone]   Lucianne Lei [address], [place]   [phone] Only accepts [place] Access [place] patients after they have their name applied to their card.   Self-Pay (no insurance) in [facility]:  Organization         Address  Phone   Notes  Sickle Cell Patients, [facility] [place] [phone]   [facility] Urgent Care [place] [phone]   [facility] [place]  [place], [place], [place] [phone]   [facility]/Dr. Osei-Bonsu  [address], [place] or [address], [place] [phone] Phone number for both [place] and [place] locations is the same.  [facility] [address] [place] [phone]   [facility] [address], [place] or [address] [phone]   [facility] [address], [place] [phone], phone; [phone], fax Sees patients 1st and 3rd Saturday of every month.  Must not qualify for public or private insurance (i.e. Medicaid, Medicare, [facility] Health Choice, Veterans' Benefits)  Household income should be no more than 200% of the poverty level The clinic cannot treat you if you are pregnant or think you are pregnant  Sexually transmitted diseases are not treated at the clinic.    Dental Care: Organization         Address  Phone  Notes  [facility] Department of [facility] [place] [phone] Accepts children up to age 102 who are enrolled in  [place] or [place]; pregnant women with a Medicaid card; and children who have applied for Medicaid or [place] Health Choice, but were declined, whose parents can pay a reduced fee at time of service.  [facility] Department of [facility]  [address], [place] [phone] Accepts children up to age 74 who are enrolled in [place] or [place]; pregnant women with a Medicaid card; and children who have applied for Medicaid or [place] Health Choice, but were declined, whose parents can pay a reduced fee at time of service.  [facility]  [place] [phone] Patients are seen by appointment only. Walk-ins are not accepted. [place] will see patients 40 years of age and older. Monday - Tuesday (8am-5pm) Most Wednesdays (8:30-5pm) $30 per visit, cash  only  [facility] Adult [facility] Access PRO[facility]  [address], [facility] [phone] Patients are seen by appointment only. Walk-ins are not accepted. [place] will see patients 14 years of age and older. One Wednesday Evening (Monthly: Volunteer Based).  $30 per visit, cash only  [place]  [phone] for adults; Children under age 48, call Graduate Pediatric Dentistry at [phone]. Children aged 39-14, please call [phone] to request a pediatric application.  Dental services are provided in all areas of dental care including fillings, crowns and bridges, complete and partial dentures, implants, gum treatment, root canals, and extractions. Preventive care is also provided. Treatment is provided to both adults and children. Patients are selected via a lottery and there is often a waiting list.   [facility] [address] [place]  [phone] [email or web address]   [facility] [address] [place], [place] [phone], Ext. [phone] Second and Fourth Thursday of each month, opens at 6:30  AM; Clinic ends at 9 AM.  Patients are seen on a first-come first-served basis, and a limited number are seen during each clinic.   [facility]  [address] Hillard Danker [place], [place] [phone]   Eligibility Requirements You must have lived in [place], [place], or [place] counties for at least the last three months.   You cannot be eligible for state or federal sponsored [employer], including [employer], [place], or [employer].   You generally cannot be eligible for healthcare insurance through your employer.    How to apply: Eligibility screenings are held every Tuesday and Wednesday afternoon from 1:00 pm until 4:00 pm. You do not need an appointment for the interview!  [facility] [address], [place], [place]   [place]  [place] Department  [place]  [phone]    Behavioral Health Resources in the Community: Intensive Outpatient Programs Organization         Address  Phone  Notes  [place]. [address], [place], [place] [phone]   [facility] Outpatient [address] [place], [place]   ADS: Alcohol & Drug Svcs [address], [place], [place]   [place] [address],  [place], [place] or [phone]   Substance Abuse Resources Organization         Address  Phone  Notes  Alcohol and Drug Services  [phone]   [place]  [phone]   [facility] [place]   Chinita Pester  [phone]   Residential & Outpatient Substance Abuse Program  [phone]   Psychological Services Organization         Address  Phone  Notes  [facility] [place]  [place]  [phone]   [place] [address], [place] or  [phone]    Mobile Crisis Teams Organization         Address  Phone  Notes  [facility], Mobile Crisis Care Unit  [phone]   [facility]  [address] [place], [place]   [place] Levels [address] [place] [phone]    Self-Help/Support Groups Organization         Address  Phone  Notes  [facility] [place] - variety of support groups  [place]ora Call for more information  Narcotics Anonymous (NA), [facility] [address], [employer] [place]  2 meetings at this location   Special educational needs teacher         Address  Phone  Notes  [facility] Residential Treatment [place],    [place]  [phone]   [facility]  [address] [place] [id number], [place], [place]   [place], [place] [phone] Admissions: 8am-3pm M-F  Incentives Substance [place] [address],    [place], [place] [phone]   [facility] [address] [place], [place], [place]   [facility] [address],  [place], [place]   [facility] - Intensive Outpatient [address] Kristeen Mans 22, [place] at [place], [place]   [facility] ([place]ie.,  [place], [place] [phone] or [phone]   [facility] Treatment Services (RTS) [address] [place], [place] Accepts Medicaid  [facility] [place] [address],  [place] [phone] Substance Abuse/Addiction Treatment   [facility] Organization         Address  Phone  Notes  [facility]  [phone]   Domenic Schwab, PhD [address] [place], [place]   [phone] or [phone]   [place], [place] [phone]   [facility] [address],  [place], [place] [phone] Insurance/Medicaid/sponsorship through [facility] and Families [address], [address] [place]                                    [place], [place] [phone] [place] [address], [place] [phone]    Dr. Adele Schilder  [phone]   [facility] [place] Dept. 1) [address], [place] 2) [place] 3)  [place] [address], [place] [phone]  [phone]   [place] [phone] or [phone] (After Hours)

## 2014-11-08 NOTE — ED Notes (Signed)
Pt requested to talk to Dr Ethelda Chick and pt is going to be discharged

## 2015-04-07 ENCOUNTER — Emergency Department (HOSPITAL_COMMUNITY): Admission: EM | Admit: 2015-04-07 | Discharge: 2015-04-08 | Payer: Self-pay

## 2015-04-07 DIAGNOSIS — Z3202 Encounter for pregnancy test, result negative: Secondary | ICD-10-CM | POA: Insufficient documentation

## 2015-04-07 DIAGNOSIS — F23 Brief psychotic disorder: Secondary | ICD-10-CM | POA: Insufficient documentation

## 2015-04-07 LAB — SALICYLATE LEVEL

## 2015-04-07 LAB — CBC
HCT: 36.5 % (ref 36.0–46.0)
Hemoglobin: 11.9 g/dL — ABNORMAL LOW (ref 12.0–15.0)
MCH: 31.2 pg (ref 26.0–34.0)
MCHC: 32.6 g/dL (ref 30.0–36.0)
MCV: 95.5 fL (ref 78.0–100.0)
PLATELETS: 441 10*3/uL — AB (ref 150–400)
RBC: 3.82 MIL/uL — ABNORMAL LOW (ref 3.87–5.11)
RDW: 14.3 % (ref 11.5–15.5)
WBC: 8.9 10*3/uL (ref 4.0–10.5)

## 2015-04-07 LAB — COMPREHENSIVE METABOLIC PANEL
ALK PHOS: 71 U/L (ref 38–126)
ALT: 14 U/L (ref 14–54)
ANION GAP: 12 (ref 5–15)
AST: 21 U/L (ref 15–41)
Albumin: 4.3 g/dL (ref 3.5–5.0)
BILIRUBIN TOTAL: 0.7 mg/dL (ref 0.3–1.2)
BUN: 11 mg/dL (ref 6–20)
CALCIUM: 9.3 mg/dL (ref 8.9–10.3)
CO2: 25 mmol/L (ref 22–32)
Chloride: 104 mmol/L (ref 101–111)
Creatinine, Ser: 0.82 mg/dL (ref 0.44–1.00)
Glucose, Bld: 88 mg/dL (ref 65–99)
Potassium: 3.6 mmol/L (ref 3.5–5.1)
SODIUM: 141 mmol/L (ref 135–145)
TOTAL PROTEIN: 7.7 g/dL (ref 6.5–8.1)

## 2015-04-07 LAB — RAPID URINE DRUG SCREEN, HOSP PERFORMED
AMPHETAMINES: NOT DETECTED
Barbiturates: NOT DETECTED
Benzodiazepines: NOT DETECTED
Cocaine: NOT DETECTED
Opiates: NOT DETECTED
Tetrahydrocannabinol: NOT DETECTED

## 2015-04-07 LAB — PREGNANCY, URINE: PREG TEST UR: NEGATIVE

## 2015-04-07 LAB — ETHANOL: Alcohol, Ethyl (B): <5 mg/dL (ref <5)

## 2015-04-07 LAB — ACETAMINOPHEN LEVEL

## 2015-04-07 MED ORDER — ACETAMINOPHEN 325 MG PO TABS
650.0000 mg | ORAL_TABLET | Freq: Once | ORAL | Status: AC
  Administered 2015-04-07: 650 mg via ORAL
  Filled 2015-04-07: qty 2

## 2015-04-07 MED ORDER — RISPERIDONE 2 MG PO TBDP
8.0000 mg | ORAL_TABLET | Freq: Every day | ORAL | Status: DC
  Administered 2015-04-07: 8 mg via ORAL
  Filled 2015-04-07 (×2): qty 4

## 2015-04-07 MED ORDER — ATENOLOL 12.5 MG HALF TABLET
12.5000 mg | ORAL_TABLET | Freq: Every day | ORAL | Status: DC
  Administered 2015-04-07: 12.5 mg via ORAL
  Filled 2015-04-07 (×2): qty 1

## 2015-04-07 MED ORDER — LORAZEPAM 0.5 MG PO TABS
0.2500 mg | ORAL_TABLET | Freq: Every day | ORAL | Status: DC
  Administered 2015-04-07: 0.25 mg via ORAL
  Filled 2015-04-07: qty 1

## 2015-04-07 NOTE — BH Assessment (Addendum)
Received call from Eulogio Bear, RN at [facility] stating that their psychiatrist Dr. Judie Petit. Roselle Locus has accepted Pt. Nursing report can be called to [phone]. Because Pt is under IVC, [employer] Dept will not transport until morning. Notified Dr. Doug Sou who said to asking nursing staff to notify EDP at 0700 so discharge order and EMTALA can be completed. Notified Carrolyn Leigh, RN in [place] of acceptance and Dr. Ethelda Chick request.  Harlin Rain Patsy [place], [facility], [facility], [facility] Triage Specialist [phone]

## 2015-04-07 NOTE — ED Provider Notes (Signed)
Patient is under involuntary commitment. Patient was walking down the middle of the road. On exam patient is delusional and paranoid stating to me "I'm not a blood donor" she has history of schizophrenia. She is alert Glasgow Coma Score 15, argumentative. Moves all extremities well Results for orders placed or performed during the hospital encounter of 04/07/15  Comprehensive metabolic panel  Result Value Ref Range   Sodium 141 135 - 145 mmol/L   Potassium 3.6 3.5 - 5.1 mmol/L   Chloride 104 101 - 111 mmol/L   CO2 25 22 - 32 mmol/L   Glucose, Bld 88 65 - 99 mg/dL   BUN 11 6 - 20 mg/dL   Creatinine, Ser 1.61 0.44 - 1.00 mg/dL   Calcium 9.3 8.9 - 09.6 mg/dL   Total Protein 7.7 6.5 - 8.1 g/dL   Albumin 4.3 3.5 - 5.0 g/dL   AST 21 15 - 41 U/L   ALT 14 14 - 54 U/L   Alkaline Phosphatase 71 38 - 126 U/L   Total Bilirubin 0.7 0.3 - 1.2 mg/dL   GFR calc non Af Amer >60 >60 mL/min   GFR calc Af Amer >60 >60 mL/min   Anion gap 12 5 - 15  Ethanol (ETOH)  Result Value Ref Range   Alcohol, Ethyl (B) <5 <5 mg/dL  Salicylate level  Result Value Ref Range   Salicylate Lvl <4.0 2.8 - 30.0 mg/dL  Acetaminophen level  Result Value Ref Range   Acetaminophen (Tylenol), Serum <10 (L) 10 - 30 ug/mL  CBC  Result Value Ref Range   WBC 8.9 4.0 - 10.5 K/uL   RBC 3.82 (L) 3.87 - 5.11 MIL/uL   Hemoglobin 11.9 (L) 12.0 - 15.0 g/dL   HCT 04.5 40.9 - 81.1 %   MCV 95.5 78.0 - 100.0 fL   MCH 31.2 26.0 - 34.0 pg   MCHC 32.6 30.0 - 36.0 g/dL   RDW 91.4 78.2 - 95.6 %   Platelets 441 (H) 150 - 400 K/uL  Urine rapid drug screen (hosp performed) (Not at [facility])  Result Value Ref Range   Opiates NONE DETECTED NONE DETECTED   Cocaine NONE DETECTED NONE DETECTED   Benzodiazepines NONE DETECTED NONE DETECTED   Amphetamines NONE DETECTED NONE DETECTED   Tetrahydrocannabinol NONE DETECTED NONE DETECTED   Barbiturates NONE DETECTED NONE DETECTED  Pregnancy, urine  Result Value Ref Range   Preg Test, Ur NEGATIVE  NEGATIVE   No results found.   Doug Sou, MD 04/07/15 2052

## 2015-04-07 NOTE — ED Notes (Signed)
Pt becoming belligerent, not making sense, arguing with PA. PA notified that patient is not cooperating with RN.

## 2015-04-07 NOTE — ED Provider Notes (Signed)
CSN: [id number]     Arrival date & time 04/07/15  1749 History   First MD Initiated Contact with Patient 04/07/15 1814     Chief Complaint  Patient presents with  . IVC      (Consider location/radiation/quality/duration/timing/severity/associated sxs/prior Treatment) HPI   Patient is a 45 year old female with PMH of schizophrenia who presents to the ED accompanied by [employer] for IVC. GPD report that they were called out this afternoon due to the patient standing in the middle of the road and causing multiple disturbances. He notes the patient was yelling at people and cars and people walking on the street. Sheriff states that the patient was talking to people about concentration camps. 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 Sheriff notes the patient is homeless.  During evaluation patient was refusing to answer my questions and reports that she wanted to see her "personal physician". She became belligerent and started yelling reporting that she refused to answer any questions at this time and wanted new scrubs at where her appropriate size. After discussing with patient that we would try to find her smaller scrubs she continued to yell and refused to answer any questions or cooperate.  No past medical history on file. No past surgical history on file. No family history on file. Social History  Substance Use Topics  . Smoking status: Never Smoker   . Smokeless tobacco: Not on file  . Alcohol Use: No   OB History    No data available     Review of Systems  Unable to perform ROS: Psychiatric disorder      Allergies  Review of patient's allergies indicates no known allergies.  Home Medications   Prior to Admission medications   Medication Sig Start Date End Date Taking? Authorizing Provider  ibuprofen (ADVIL,MOTRIN) 200 MG tablet Take 200 mg by mouth every 6 (six) hours as needed for headache.   Yes  Historical Provider, MD  amantadine (SYMMETREL) 100 MG capsule Take 1 capsule (100 mg total) by mouth 2 (two) times daily. Patient not taking: Reported on 04/07/2015 10/28/14   Jimmy Footman, MD  atenolol (TENORMIN) 25 MG tablet Take 0.5 tablets (12.5 mg total) by mouth daily. Patient not taking: Reported on 04/07/2015 10/28/14   Jimmy Footman, MD  fluticasone [facility]) 50 MCG/ACT nasal spray Place 2 sprays into both nostrils daily. Patient not taking: Reported on 04/07/2015 10/28/14   Jimmy Footman, MD  loratadine (CLARITIN) 10 MG tablet Take 1 tablet (10 mg total) by mouth daily. Patient not taking: Reported on 04/07/2015 10/28/14   Jimmy Footman, MD  LORazepam (ATIVAN) 0.5 MG tablet Take 0.5 tablets (0.25 mg total) by mouth at bedtime. Patient not taking: Reported on 04/07/2015 10/28/14   Jimmy Footman, MD  risperiDONE (RISPERDAL M-TABS) 4 MG disintegrating tablet Take 2 tablets (8 mg total) by mouth at bedtime. Patient not taking: Reported on 04/07/2015 10/28/14   Jimmy Footman, MD  sodium chloride (OCEAN) 0.65 % SOLN nasal spray Place 1 spray into both nostrils as needed for congestion. Patient not taking: Reported on 04/07/2015 10/28/14   Jimmy Footman, MD   BP 150/100 mmHg  Pulse 97  Temp(Src) 98.2 F (36.8 C) (Oral)  Resp 16  SpO2 98%  LMP 03/24/2015 (Approximate) Physical Exam  Constitutional: She is oriented to person, place, and time. She appears well-developed and well-nourished.  HENT:  Head: Normocephalic and atraumatic.  Eyes: Conjunctivae and EOM are normal.  Right eye exhibits no discharge. Left eye exhibits no discharge. No scleral icterus.  Neck: Normal range of motion. Neck supple.  Pulmonary/Chest: Effort normal.  Neurological: She is alert and oriented to person, place, and time.  Psychiatric: Her affect is angry. She is agitated. Thought content is paranoid and delusional.  Nursing note and  vitals reviewed.   ED Course  Procedures (including critical care time) Labs Review Labs Reviewed  ACETAMINOPHEN LEVEL - Abnormal; Notable for the following:    Acetaminophen (Tylenol), Serum <10 (*)    All other components within normal limits  CBC - Abnormal; Notable for the following:    RBC 3.82 (*)    Hemoglobin 11.9 (*)    Platelets 441 (*)    All other components within normal limits  COMPREHENSIVE METABOLIC PANEL  ETHANOL  SALICYLATE LEVEL  URINE RAPID DRUG SCREEN, HOSP PERFORMED  PREGNANCY, URINE    Imaging Review No results found. I have personally reviewed and evaluated these images and lab results as part of my medical decision-making.  Filed Vitals:   04/07/15 1950 04/07/15 2130  BP:  150/100  Pulse: 89 97  Temp: 97.4 F (36.3 C) 98.2 F (36.8 C)  Resp: 18 16     MDM   Final diagnoses:  Acute psychosis    Patient presents under involuntary commitment. GPD report patient was found walking down the middle of the road. VSS. On exam patient is delusional and paranoid. Pt became agitated and would not answer any questions. She would not let me examine her or allow lab to draw blood. Pt was then evaluated by Dr. Ethelda Chick and pt allowed to have blood drawn. Labs unremarkable. Pt medically cleared for TTS.     Satira Sark [place], [place] 04/08/15 0154  Doug Sou, MD 04/08/15 1020

## 2015-04-07 NOTE — Progress Notes (Signed)
Donell Sievert, PA recommends inpatient treatment.  Patient has been referred for inpatient psych treatment at: [facility] - per intake, d/c in am, fax referral. [facility] - per Arlys John, fax referral for review. [facility] - no answer. [facility] - per Dondra Spry, 2 d/c in am, ok to fax. [facility] - per intake, fax referral for the waitlist. [facility] - per [place], fax referral for the waitlist, 1 geriatric female only. [facility]   At capacity: [facility] - per Winfield Cunas - Lafonda Mosses [place], [place] Disposition staff 04/07/2015 10:50 PM
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
## 2015-04-07 NOTE — ED Notes (Signed)
UNABLE TO COLLECT LABS AT THIS TIME BECAUSE OF PATIENT BEHAVIOR. 
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
## 2015-04-08 MED ORDER — HYDROCOD POLST-CPM POLST ER 10-8 MG/5ML PO SUER
5.0000 mL | Freq: Once | ORAL | Status: AC
  Administered 2015-04-08: 5 mL via ORAL
  Filled 2015-04-08: qty 5

## 2015-04-08 NOTE — ED Notes (Signed)
When [facility] arrived to transport patient she was very irritable and refused to get out of bed.  She argued that because she did not have family in [place] she did not have to go there.  After much discussion Sheriff was able to get patient to comply.  Pt was in no distress at discharge.  All belongings were returned to pt. Although she refused to sign paper.

## 2016-09-27 IMAGING — CR DG CHEST 2V
2 series · 2 of 2 positions shown · non-contrast
Comparison: Chest radiograph 10/28/2014

CLINICAL DATA: Patient with palpitations. Recent psychiatric
admission.

EXAM:
CHEST  2 VIEW

[w chest pa]
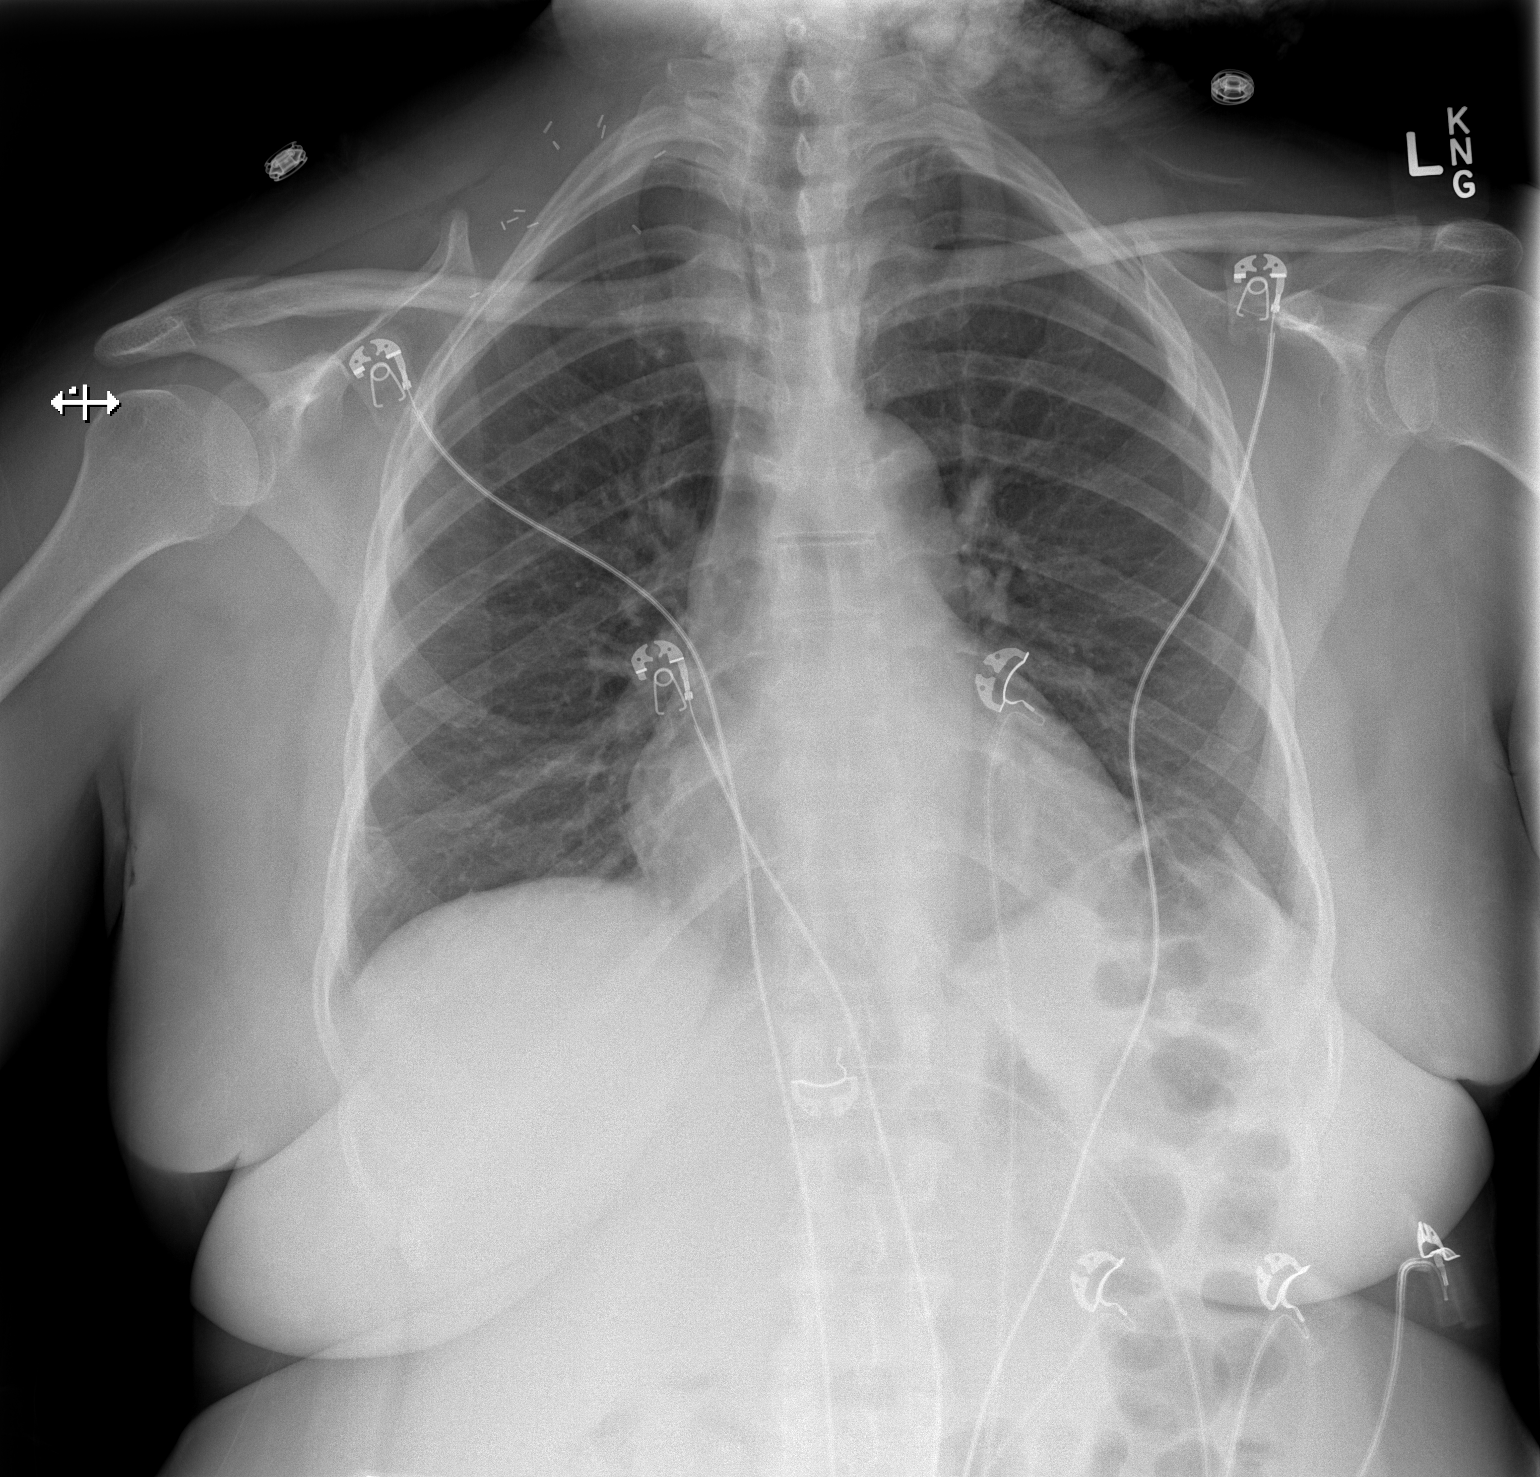

[w chest lat]
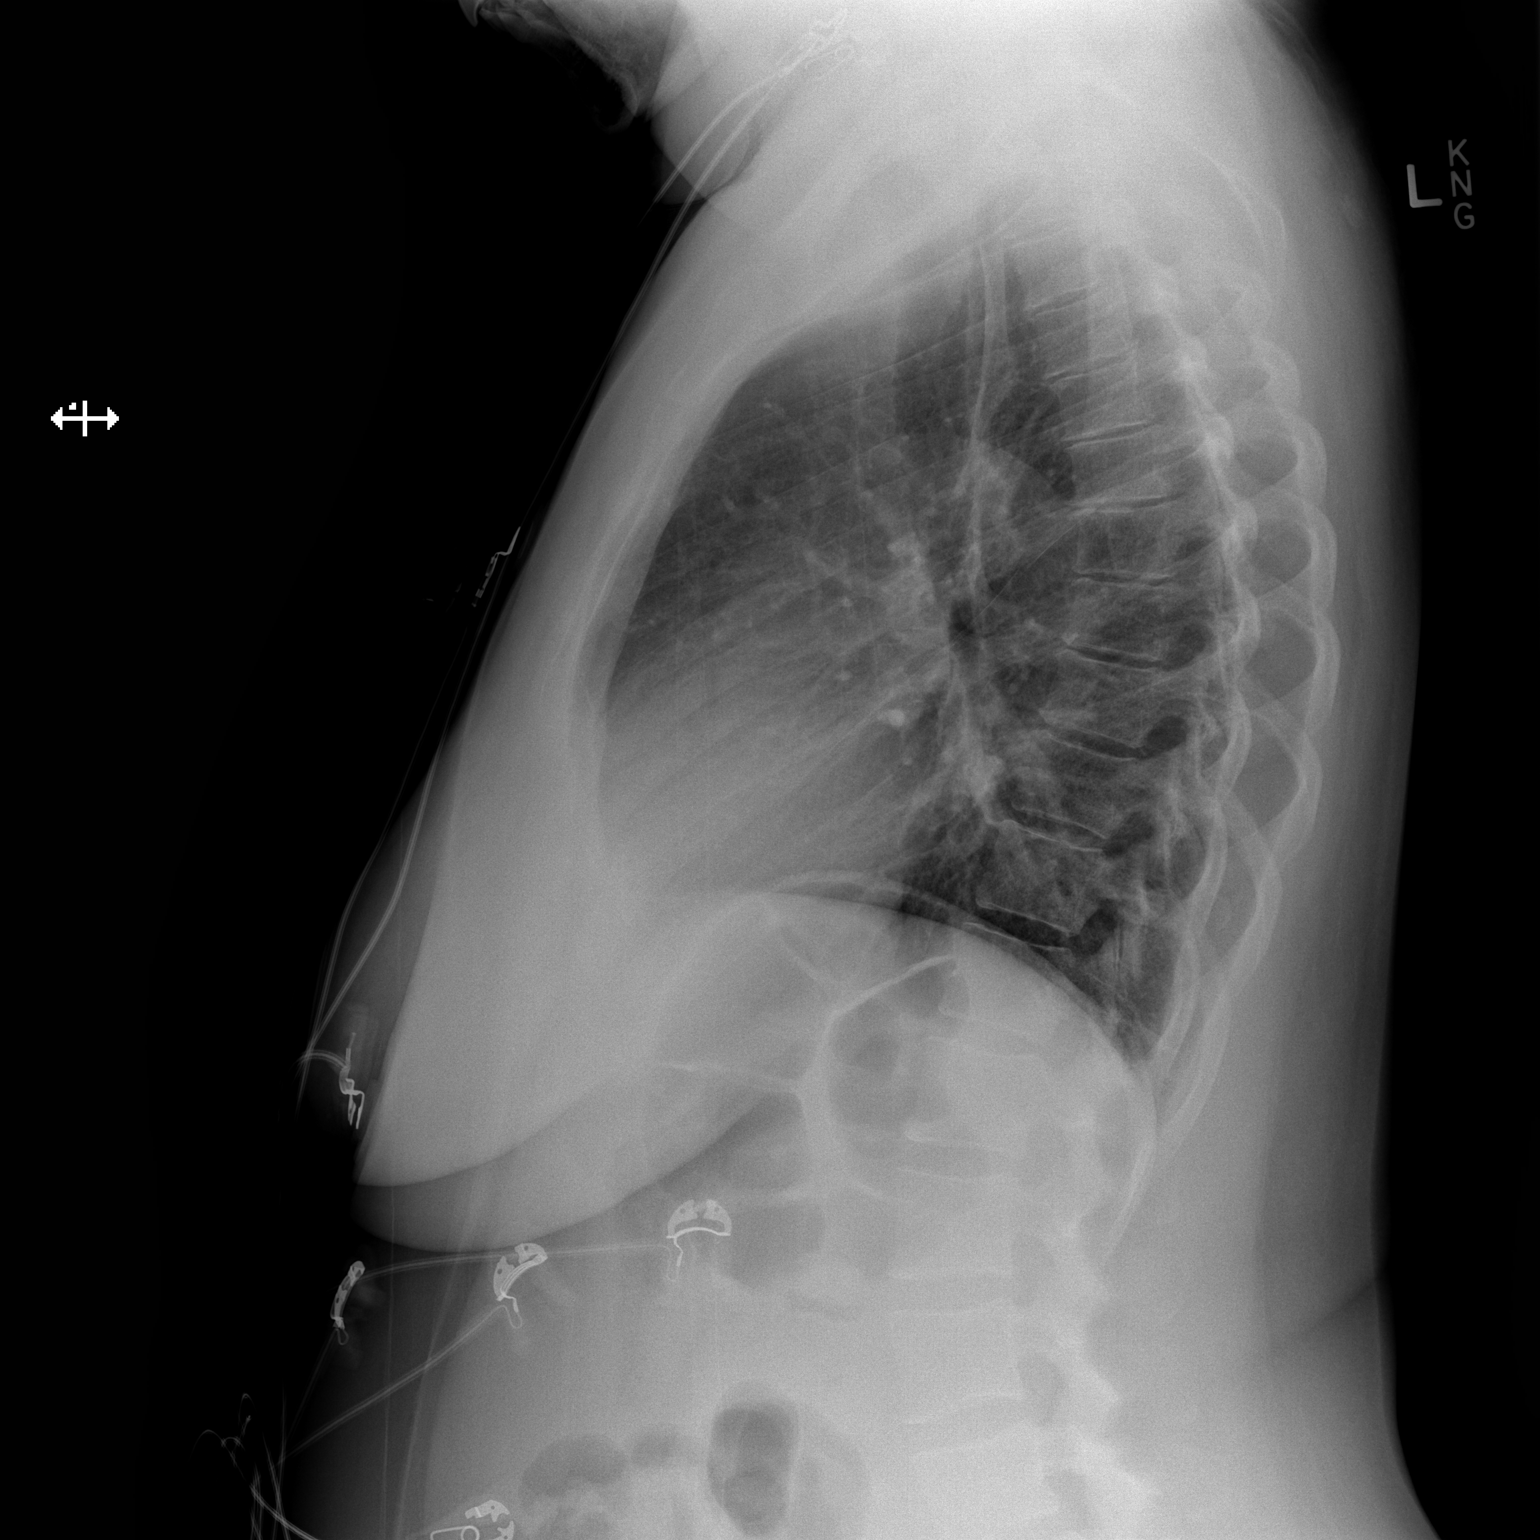

[2 of 2 positions shown; findings below may reference images not displayed]

FINDINGS: Monitoring leads overlie the patient. Stable cardiac and mediastinal
contours. Linear opacity right lung base, stable. Eventration left
hemidiaphragm. Surgical clips project over the right lower cervical
soft tissues. Mid thoracic spine degenerative changes.
IMPRESSION: No acute cardiopulmonary process. Probable right basilar atelectasis
and or scarring.
